# Patient Record
Sex: Male | Born: 1960 | Race: White | Hispanic: No | Marital: Single | Smoking: Current every day smoker
Health system: Southern US, Community
[De-identification: ages and names within clinical notes are randomized; demographics above are authoritative.]

## PROBLEM LIST (undated history)

## (undated) DIAGNOSIS — F1010 Alcohol abuse, uncomplicated: Secondary | ICD-10-CM

## (undated) DIAGNOSIS — K7030 Alcoholic cirrhosis of liver without ascites: Secondary | ICD-10-CM

## (undated) HISTORY — PX: NO PAST SURGERIES: SHX2092

---

## 2006-02-09 ENCOUNTER — Emergency Department (HOSPITAL_COMMUNITY): Admission: EM | Admit: 2006-02-09 | Discharge: 2006-02-09 | Payer: Self-pay | Admitting: Emergency Medicine

## 2012-05-02 ENCOUNTER — Emergency Department (HOSPITAL_COMMUNITY)
Admission: EM | Admit: 2012-05-02 | Discharge: 2012-05-02 | Disposition: A | Discharge status: Home / Self Care | Payer: Self-pay | Attending: Emergency Medicine | Admitting: Emergency Medicine

## 2012-05-02 ENCOUNTER — Encounter (HOSPITAL_COMMUNITY): Payer: Self-pay

## 2012-05-02 DIAGNOSIS — M549 Dorsalgia, unspecified: Secondary | ICD-10-CM | POA: Insufficient documentation

## 2012-05-02 DIAGNOSIS — H5789 Other specified disorders of eye and adnexa: Secondary | ICD-10-CM | POA: Insufficient documentation

## 2012-05-02 NOTE — ED Notes (Signed)
Pt here with back pain to bilateral legs, sts burning in nature, etoh on board, vomits q AM, Skin lesions, Cyst under eye that he is concerned for. Dark Urine, BP stable, CBG 110 with ems, bruising to forearm

## 2017-07-16 ENCOUNTER — Emergency Department (HOSPITAL_COMMUNITY): Payer: BLUE CROSS/BLUE SHIELD

## 2017-07-16 ENCOUNTER — Encounter (HOSPITAL_COMMUNITY): Payer: Self-pay | Admitting: *Deleted

## 2017-07-16 ENCOUNTER — Observation Stay (HOSPITAL_COMMUNITY)
Admission: EM | Admit: 2017-07-16 | Discharge: 2017-07-17 | Disposition: A | Discharge status: Home / Self Care | Payer: BLUE CROSS/BLUE SHIELD | Attending: Family Medicine | Admitting: Family Medicine

## 2017-07-16 DIAGNOSIS — F1010 Alcohol abuse, uncomplicated: Secondary | ICD-10-CM | POA: Insufficient documentation

## 2017-07-16 DIAGNOSIS — I8500 Esophageal varices without bleeding: Secondary | ICD-10-CM

## 2017-07-16 DIAGNOSIS — E871 Hypo-osmolality and hyponatremia: Secondary | ICD-10-CM | POA: Diagnosis not present

## 2017-07-16 DIAGNOSIS — R109 Unspecified abdominal pain: Secondary | ICD-10-CM | POA: Diagnosis present

## 2017-07-16 DIAGNOSIS — I8510 Secondary esophageal varices without bleeding: Secondary | ICD-10-CM | POA: Diagnosis not present

## 2017-07-16 DIAGNOSIS — N281 Cyst of kidney, acquired: Secondary | ICD-10-CM | POA: Diagnosis not present

## 2017-07-16 DIAGNOSIS — K7460 Unspecified cirrhosis of liver: Secondary | ICD-10-CM | POA: Diagnosis not present

## 2017-07-16 DIAGNOSIS — Z23 Encounter for immunization: Secondary | ICD-10-CM | POA: Diagnosis not present

## 2017-07-16 DIAGNOSIS — K449 Diaphragmatic hernia without obstruction or gangrene: Secondary | ICD-10-CM | POA: Insufficient documentation

## 2017-07-16 DIAGNOSIS — R188 Other ascites: Secondary | ICD-10-CM | POA: Diagnosis not present

## 2017-07-16 DIAGNOSIS — K766 Portal hypertension: Secondary | ICD-10-CM | POA: Diagnosis not present

## 2017-07-16 DIAGNOSIS — I864 Gastric varices: Secondary | ICD-10-CM | POA: Insufficient documentation

## 2017-07-16 DIAGNOSIS — F17210 Nicotine dependence, cigarettes, uncomplicated: Secondary | ICD-10-CM | POA: Insufficient documentation

## 2017-07-16 LAB — CBC
HCT: 37.7 % — ABNORMAL LOW (ref 39.0–52.0)
Hemoglobin: 13.4 g/dL (ref 13.0–17.0)
MCH: 36.3 pg — AB (ref 26.0–34.0)
MCHC: 35.5 g/dL (ref 30.0–36.0)
MCV: 102.2 fL — ABNORMAL HIGH (ref 78.0–100.0)
PLATELETS: 356 10*3/uL (ref 150–400)
RBC: 3.69 MIL/uL — ABNORMAL LOW (ref 4.22–5.81)
RDW: 12.3 % (ref 11.5–15.5)
WBC: 13.7 10*3/uL — ABNORMAL HIGH (ref 4.0–10.5)

## 2017-07-16 LAB — COMPREHENSIVE METABOLIC PANEL
ALBUMIN: 2.7 g/dL — AB (ref 3.5–5.0)
ALK PHOS: 145 U/L — AB (ref 38–126)
ALT: 42 U/L (ref 17–63)
AST: 74 U/L — AB (ref 15–41)
Anion gap: 11 (ref 5–15)
BUN: 7 mg/dL (ref 6–20)
CALCIUM: 8.6 mg/dL — AB (ref 8.9–10.3)
CO2: 23 mmol/L (ref 22–32)
CREATININE: 0.93 mg/dL (ref 0.61–1.24)
Chloride: 96 mmol/L — ABNORMAL LOW (ref 101–111)
GFR calc Af Amer: >60 mL/min (ref >60)
GFR calc non Af Amer: >60 mL/min (ref >60)
GLUCOSE: 106 mg/dL — AB (ref 65–99)
Potassium: 3.8 mmol/L (ref 3.5–5.1)
SODIUM: 130 mmol/L — AB (ref 135–145)
Total Bilirubin: 1.3 mg/dL — ABNORMAL HIGH (ref 0.3–1.2)
Total Protein: 6.5 g/dL (ref 6.5–8.1)

## 2017-07-16 LAB — LIPASE, BLOOD: Lipase: 69 U/L — ABNORMAL HIGH (ref 11–51)

## 2017-07-16 LAB — PROTIME-INR
INR: 1.07
PROTHROMBIN TIME: 13.8 s (ref 11.4–15.2)

## 2017-07-16 MED ORDER — ONDANSETRON HCL 4 MG/2ML IJ SOLN
4.0000 mg | Freq: Once | INTRAMUSCULAR | Status: AC
  Administered 2017-07-16: 4 mg via INTRAVENOUS
  Filled 2017-07-16: qty 2

## 2017-07-16 MED ORDER — SODIUM CHLORIDE 0.9 % IV BOLUS (SEPSIS)
500.0000 mL | Freq: Once | INTRAVENOUS | Status: AC
  Administered 2017-07-16: 500 mL via INTRAVENOUS

## 2017-07-16 MED ORDER — IOPAMIDOL (ISOVUE-300) INJECTION 61%
INTRAVENOUS | Status: AC
  Administered 2017-07-16: 100 mL
  Filled 2017-07-16: qty 30

## 2017-07-16 NOTE — ED Notes (Addendum)
Pt stated he couldn't provide a urine sample right now

## 2017-07-16 NOTE — ED Notes (Signed)
Pt transported back to CT 

## 2017-07-16 NOTE — ED Notes (Signed)
Pt aware of need for urine  

## 2017-07-16 NOTE — ED Triage Notes (Signed)
Pt states that he has had lower abdominal and groin pain for 2 months reports swelling and that his "belly button will push out" but states that it will go back in as well. Pt reports generalized weakness and not being able to eat.

## 2017-07-16 NOTE — ED Notes (Signed)
Pt provided with urinal and encouraged to attempt urine sample

## 2017-07-16 NOTE — ED Notes (Signed)
Pt brought back from CT.  States he had to have diarrhea.  Pt assisted to the restroom, gown changed, and pt given belongings bag for soiled underwear

## 2017-07-16 NOTE — ED Provider Notes (Signed)
[facility] EMERGENCY DEPARTMENT Provider Note   CSN: [id number] Arrival date & time: 07/16/17  1616     History   Chief Complaint Chief Complaint  Patient presents with  . Abdominal Pain    HPI Rontrell Moquin is a 56 y.o. male.  HPI 56 year old male who presents with abdominal pain. He has no known past medical history. Reports that for the past 2 months he has had gradually increasing abdominal distention and intermittent abdominal pain. Sometimes when he lays in bed at night he feels a hardness over the right side the abdomen. He has been having intermittent episodes of nausea, vomiting and diarrhea. Sometimes will noticed a streak of blood in his vomit or blood in the stool. Has had decreased appetite and more than a 25 pound weight loss in this period of time. Denies fevers, night sweats, melena.  States that he has been smoking and drinking alcohol for almost his entire life. Normally drinks about 40 ounces of beer after work on a daily basis. Smokes 2 packs of cigarettes daily. No cough, difficulty breathing, chest pains.    History reviewed. No pertinent past medical history.  There are no active problems to display for this patient.   History reviewed. No pertinent surgical history.     Home Medications    Prior to Admission medications   Not on File    Family History No family history on file.  Social History Social History  Substance Use Topics  . Smoking status: Current Every Day Smoker    Packs/day: 2.00  . Smokeless tobacco: Not on file  . Alcohol use Yes     Allergies   Patient has no known allergies.   Review of Systems Review of Systems  Constitutional: Negative for fever.  Respiratory: Negative for shortness of breath.   Cardiovascular: Negative for chest pain.  Gastrointestinal: Positive for abdominal pain, diarrhea and vomiting.  All other systems reviewed and are negative.    Physical Exam Updated Vital  Signs BP 103/79   Pulse 81   Temp 97.7 F (36.5 C) (Oral)   Resp 16   Ht 5\' 8"  (1.727 m)   Wt 45.4 kg (100 lb)   SpO2 100%   BMI 15.20 kg/m   Physical Exam Physical Exam  Nursing note and vitals reviewed. Constitutional: Cachectic, malnourished appearing, non-toxic, and in no acute distress Head: Normocephalic and atraumatic.  Mouth/Throat: Oropharynx is clear and dry.  Neck: Normal range of motion. Neck supple.  Cardiovascular: Normal rate and regular rhythm.   Pulmonary/Chest: Effort normal and breath sounds normal.  Abdominal: Soft. There is moderate distension. Mild diffuse tenderness. There is no rebound and no guarding.  Musculoskeletal: No deformities. Neurological: Alert, no facial droop, fluent speech, moves all extremities symmetrically Skin: Skin is warm and dry.  Psychiatric: Cooperative   ED Treatments / Results  Labs (all labs ordered are listed, but only abnormal results are displayed) Labs Reviewed  LIPASE, BLOOD - Abnormal; Notable for the following:       Result Value   Lipase 69 (*)    All other components within normal limits  COMPREHENSIVE METABOLIC PANEL - Abnormal; Notable for the following:    Sodium 130 (*)    Chloride 96 (*)    Glucose, Bld 106 (*)    Calcium 8.6 (*)    Albumin 2.7 (*)    AST 74 (*)    Alkaline Phosphatase 145 (*)    Total Bilirubin 1.3 (*)  All other components within normal limits  CBC - Abnormal; Notable for the following:    WBC 13.7 (*)    RBC 3.69 (*)    HCT 37.7 (*)    MCV 102.2 (*)    MCH 36.3 (*)    All other components within normal limits  URINALYSIS, ROUTINE W REFLEX MICROSCOPIC - Abnormal; Notable for the following:    Color, Urine AMBER (*)    APPearance CLOUDY (*)    Bilirubin Urine SMALL (*)    Protein, ur 100 (*)    Bacteria, UA RARE (*)    Squamous Epithelial / LPF 0-5 (*)    All other components within normal limits  PROTIME-INR    EKG  EKG Interpretation None       Radiology Dg  Chest 2 View  Result Date: 07/16/2017 CLINICAL DATA:  Weight loss.  Cough. EXAM: CHEST  2 VIEW COMPARISON:  None. FINDINGS: Normal heart size. Normal aortic and hilar contours. Calcified nodule in the right lung attributed to old granulomatous disease. There is no edema, consolidation, effusion, or pneumothorax. No acute osseous finding. IMPRESSION: 1. No evidence of active disease. 2. Remote granulomatous changes in the right lung. Electronically Signed   By: Marnee Spring M.D.   On: 07/16/2017 20:52   Ct Abdomen Pelvis W Contrast  Result Date: 07/17/2017 CLINICAL DATA:  Chronic intermittent right-sided abdominal pain and abdominal distention. Nausea, vomiting, diarrhea and weight loss. Initial encounter. EXAM: CT ABDOMEN AND PELVIS WITH CONTRAST TECHNIQUE: Multidetector CT imaging of the abdomen and pelvis was performed using the standard protocol following bolus administration of intravenous contrast. CONTRAST:  ISOVUE-300 IOPAMIDOL (ISOVUE-300) INJECTION 61% COMPARISON:  None. FINDINGS: Lower chest: A small to moderate hiatal hernia is identified. Esophageal varices are seen. The visualized portions of the mediastinum are unremarkable. The visualized lung bases are clear. Hepatobiliary: There is a very unusual poorly enhancing process involving most of the hepatic parenchyma. This predominantly does not distort the hepatic capsule and underlying vasculature, aside from masslike enlargement of the caudate lobe. Given underlying findings of hepatic cirrhosis and this appearance, confluent hepatic fibrosis is considered more likely. Malignancy cannot be entirely excluded. The gallbladder is grossly unremarkable in appearance. The common bile duct remains grossly normal in caliber. Pancreas: The pancreas is within normal limits. Spleen: The spleen is unremarkable in appearance. Adrenals/Urinary Tract: The adrenal glands are unremarkable in appearance. A small right renal cyst is noted. There is no  evidence of hydronephrosis. No renal or ureteral stones are identified. No perinephric stranding is appreciated. Stomach/Bowel: The stomach is unremarkable in appearance. Gastric varices are noted. The small bowel is within normal limits. The appendix is normal in caliber, without evidence of appendicitis. Wall thickening along the entirety of the colon may reflect underlying hypoalbuminemia. Vascular/Lymphatic: Minimal calcification is noted along the distal abdominal aorta and its branches. The abdominal aorta is otherwise grossly unremarkable. The inferior vena cava is grossly unremarkable. No retroperitoneal lymphadenopathy is seen. No pelvic sidewall lymphadenopathy is identified. Reproductive: The bladder is mildly distended and grossly unremarkable. The prostate remains normal in size. Other: Moderate volume ascites is noted within the abdomen and pelvis. Musculoskeletal: No acute osseous abnormalities are identified. The visualized musculature is unremarkable in appearance. IMPRESSION: 1. Very unusual poorly enhancing process involving most of the hepatic parenchyma. This primarily does not distort the hepatic capsule and underlying vasculature, aside from masslike enlargement of the caudate lobe. Given underlying hepatic cirrhosis and this appearance, confluent hepatic fibrosis is considered more  likely. Malignancy cannot be entirely excluded. Dynamic liver protocol MRI is recommended for further evaluation, when and as deemed clinically appropriate. 2. Moderate volume ascites, gastric varices and esophageal varices, compatible with hepatic cirrhosis. 3. Wall thickening along the entirety of the colon may reflect underlying hypoalbuminemia. 4. Small to moderate hiatal hernia noted. 5. Small right renal cyst seen. Electronically Signed   By: Roanna RaiderJeffery  Chang M.D.   On: 07/17/2017 00:36    Procedures Procedures (including critical care time)  Medications Ordered in ED Medications  cefTRIAXone (ROCEPHIN)  1 g in dextrose 5 % 50 mL IVPB (not administered)  sodium chloride 0.9 % bolus 500 mL (500 mLs Intravenous New Bag/Given 07/16/17 2109)  ondansetron (ZOFRAN) injection 4 mg (4 mg Intravenous Given 07/16/17 2109)  iopamidol (ISOVUE-300) 61 % injection (100 mLs  Contrast Given 07/16/17 2353)     Initial Impression / Assessment and Plan / ED Course  I have reviewed the triage vital signs and the nursing notes.  Pertinent labs & imaging results that were available during my care of the patient were reviewed by me and considered in my medical decision making (see chart for details).     56 year old male who presents with 2 month history of weight loss, intermittent nausea and vomiting with diarrhea and abdominal distention and pain. He looks cachectic and chronically ill on presentation. Concern for possible cirrhosis versus malignant process. 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 Abdomen distended, soft, generalized tenderness. With evidence of mild transaminitis, elevated alkaline phosphatase, minimal hyperbilirubinemia, and hypoalbuminemia. CT abdomen pelvis showing ascites with gastric and esophageal varices and cirrhotic liver with questionable underlying mass. He does have mild leukocytosis, and in setting of abdominal pain and ascites, will empirically treat for SBP. Plan on admission into hospital for ongoing work-up. Discussed with Dr. Selena BattenKim.  Final Clinical Impressions(s) / ED Diagnoses   Final diagnoses:  Cirrhosis of liver with ascites, unspecified hepatic cirrhosis type [place](HCC)    New Prescriptions New Prescriptions   No medications on file     Lavera GuiseLiu, Dana Duo, MD 07/17/17 [phone]

## 2017-07-16 NOTE — ED Notes (Signed)
Pt given warm blanket.

## 2017-07-16 NOTE — ED Notes (Signed)
Pt transported to xray 

## 2017-07-16 NOTE — ED Notes (Signed)
This RN attempted IV x2  

## 2017-07-16 NOTE — ED Notes (Signed)
Pt remains in triage waiting. Updated on wait for treatment room. 

## 2017-07-17 ENCOUNTER — Encounter (HOSPITAL_COMMUNITY): Payer: Self-pay | Admitting: Radiology

## 2017-07-17 ENCOUNTER — Observation Stay (HOSPITAL_COMMUNITY): Payer: BLUE CROSS/BLUE SHIELD

## 2017-07-17 DIAGNOSIS — I8510 Secondary esophageal varices without bleeding: Secondary | ICD-10-CM

## 2017-07-17 DIAGNOSIS — R188 Other ascites: Secondary | ICD-10-CM

## 2017-07-17 DIAGNOSIS — R1011 Right upper quadrant pain: Secondary | ICD-10-CM | POA: Diagnosis not present

## 2017-07-17 DIAGNOSIS — E871 Hypo-osmolality and hyponatremia: Secondary | ICD-10-CM

## 2017-07-17 DIAGNOSIS — K7031 Alcoholic cirrhosis of liver with ascites: Secondary | ICD-10-CM

## 2017-07-17 DIAGNOSIS — K7460 Unspecified cirrhosis of liver: Secondary | ICD-10-CM | POA: Diagnosis not present

## 2017-07-17 DIAGNOSIS — R109 Unspecified abdominal pain: Secondary | ICD-10-CM | POA: Diagnosis present

## 2017-07-17 DIAGNOSIS — I8500 Esophageal varices without bleeding: Secondary | ICD-10-CM

## 2017-07-17 LAB — SODIUM, URINE, RANDOM

## 2017-07-17 LAB — LACTATE DEHYDROGENASE, PLEURAL OR PERITONEAL FLUID: LD FL: 28 U/L — AB (ref 3–23)

## 2017-07-17 LAB — BODY FLUID CELL COUNT WITH DIFFERENTIAL
EOS FL: 0 %
LYMPHS FL: 8 %
Monocyte-Macrophage-Serous Fluid: 86 % (ref 50–90)
NEUTROPHIL FLUID: 6 % (ref 0–25)
Total Nucleated Cell Count, Fluid: 196 cu mm (ref 0–1000)

## 2017-07-17 LAB — URINALYSIS, ROUTINE W REFLEX MICROSCOPIC
Glucose, UA: NEGATIVE mg/dL
Hgb urine dipstick: NEGATIVE
Ketones, ur: NEGATIVE mg/dL
LEUKOCYTES UA: NEGATIVE
NITRITE: NEGATIVE
PROTEIN: 100 mg/dL — AB
SPECIFIC GRAVITY, URINE: 1.023 (ref 1.005–1.030)
pH: 5 (ref 5.0–8.0)

## 2017-07-17 LAB — COMPREHENSIVE METABOLIC PANEL
ALBUMIN: 2.3 g/dL — AB (ref 3.5–5.0)
ALK PHOS: 113 U/L (ref 38–126)
ALT: 36 U/L (ref 17–63)
AST: 62 U/L — AB (ref 15–41)
Anion gap: 13 (ref 5–15)
BILIRUBIN TOTAL: 1.2 mg/dL (ref 0.3–1.2)
BUN: 7 mg/dL (ref 6–20)
CALCIUM: 8 mg/dL — AB (ref 8.9–10.3)
CO2: 19 mmol/L — ABNORMAL LOW (ref 22–32)
CREATININE: 0.83 mg/dL (ref 0.61–1.24)
Chloride: 98 mmol/L — ABNORMAL LOW (ref 101–111)
GFR calc Af Amer: >60 mL/min (ref >60)
GLUCOSE: 72 mg/dL (ref 65–99)
Potassium: 2.9 mmol/L — ABNORMAL LOW (ref 3.5–5.1)
Sodium: 130 mmol/L — ABNORMAL LOW (ref 135–145)
TOTAL PROTEIN: 5.3 g/dL — AB (ref 6.5–8.1)

## 2017-07-17 LAB — TSH: TSH: 2.763 u[IU]/mL (ref 0.350–4.500)

## 2017-07-17 LAB — CORTISOL: CORTISOL PLASMA: 15.7 ug/dL

## 2017-07-17 LAB — CBC
HEMATOCRIT: 32.9 % — AB (ref 39.0–52.0)
Hemoglobin: 11.7 g/dL — ABNORMAL LOW (ref 13.0–17.0)
MCH: 36.1 pg — AB (ref 26.0–34.0)
MCHC: 35.6 g/dL (ref 30.0–36.0)
MCV: 101.5 fL — ABNORMAL HIGH (ref 78.0–100.0)
Platelets: 321 10*3/uL (ref 150–400)
RBC: 3.24 MIL/uL — ABNORMAL LOW (ref 4.22–5.81)
RDW: 12.5 % (ref 11.5–15.5)
WBC: 11.9 10*3/uL — AB (ref 4.0–10.5)

## 2017-07-17 LAB — HIV ANTIBODY (ROUTINE TESTING W REFLEX): HIV Screen 4th Generation wRfx: NONREACTIVE

## 2017-07-17 LAB — GRAM STAIN

## 2017-07-17 LAB — OSMOLALITY, URINE: Osmolality, Ur: 606 mOsm/kg (ref 300–900)

## 2017-07-17 LAB — PROTEIN, PLEURAL OR PERITONEAL FLUID

## 2017-07-17 LAB — MAGNESIUM: Magnesium: 1.5 mg/dL — ABNORMAL LOW (ref 1.7–2.4)

## 2017-07-17 LAB — ALBUMIN, PLEURAL OR PERITONEAL FLUID

## 2017-07-17 LAB — OSMOLALITY: OSMOLALITY: 269 mosm/kg — AB (ref 275–295)

## 2017-07-17 MED ORDER — HYOSCYAMINE SULFATE 0.125 MG PO TBDP
0.1250 mg | ORAL_TABLET | ORAL | Status: DC | PRN
  Administered 2017-07-17: 0.125 mg via SUBLINGUAL
  Filled 2017-07-17 (×2): qty 1

## 2017-07-17 MED ORDER — ENSURE ENLIVE PO LIQD: 237.0000 mL | Freq: Two times a day (BID) | ORAL | Status: DC

## 2017-07-17 MED ORDER — ADULT MULTIVITAMIN W/MINERALS CH
1.0000 | ORAL_TABLET | Freq: Every day | ORAL | Status: DC
  Administered 2017-07-17: 1 via ORAL
  Filled 2017-07-17: qty 1

## 2017-07-17 MED ORDER — POTASSIUM CHLORIDE CRYS ER 20 MEQ PO TBCR
40.0000 meq | EXTENDED_RELEASE_TABLET | Freq: Once | ORAL | Status: AC
  Administered 2017-07-17: 40 meq via ORAL
  Filled 2017-07-17: qty 2

## 2017-07-17 MED ORDER — PNEUMOCOCCAL VAC POLYVALENT 25 MCG/0.5ML IJ INJ
0.5000 mL | INJECTION | INTRAMUSCULAR | Status: DC
  Filled 2017-07-17: qty 0.5

## 2017-07-17 MED ORDER — GADOBENATE DIMEGLUMINE 529 MG/ML IV SOLN
10.0000 mL | Freq: Once | INTRAVENOUS | Status: AC | PRN
  Administered 2017-07-17: 10 mL via INTRAVENOUS

## 2017-07-17 MED ORDER — SODIUM CHLORIDE 0.9 % IV SOLN: 250.0000 mL | INTRAVENOUS | Status: DC | PRN

## 2017-07-17 MED ORDER — VITAMIN B-1 100 MG PO TABS
100.0000 mg | ORAL_TABLET | Freq: Every day | ORAL | Status: DC
  Administered 2017-07-17: 100 mg via ORAL
  Filled 2017-07-17: qty 1

## 2017-07-17 MED ORDER — DEXTROSE 5 % IV SOLN
1.0000 g | Freq: Once | INTRAVENOUS | Status: AC
  Administered 2017-07-17: 1 g via INTRAVENOUS
  Filled 2017-07-17: qty 10

## 2017-07-17 MED ORDER — LORAZEPAM 1 MG PO TABS: 1.0000 mg | ORAL_TABLET | Freq: Four times a day (QID) | ORAL | Status: DC | PRN

## 2017-07-17 MED ORDER — THIAMINE HCL 100 MG PO TABS: 100.0000 mg | ORAL_TABLET | Freq: Every day | ORAL | 0 refills | Status: DC

## 2017-07-17 MED ORDER — INFLUENZA VAC SPLIT QUAD 0.5 ML IM SUSY
0.5000 mL | PREFILLED_SYRINGE | INTRAMUSCULAR | Status: AC
  Administered 2017-07-17: 0.5 mL via INTRAMUSCULAR
  Filled 2017-07-17: qty 0.5

## 2017-07-17 MED ORDER — LIDOCAINE HCL (PF) 1 % IJ SOLN
INTRAMUSCULAR | Status: AC
  Administered 2017-07-17: 10:00:00 via SUBCUTANEOUS
  Filled 2017-07-17: qty 30

## 2017-07-17 MED ORDER — FOLIC ACID 1 MG PO TABS
1.0000 mg | ORAL_TABLET | Freq: Every day | ORAL | Status: DC
  Administered 2017-07-17: 1 mg via ORAL
  Filled 2017-07-17: qty 1

## 2017-07-17 MED ORDER — SODIUM CHLORIDE 0.9% FLUSH
3.0000 mL | Freq: Two times a day (BID) | INTRAVENOUS | Status: DC
  Administered 2017-07-17: 3 mL via INTRAVENOUS

## 2017-07-17 MED ORDER — SODIUM CHLORIDE 0.9% FLUSH: 3.0000 mL | INTRAVENOUS | Status: DC | PRN

## 2017-07-17 MED ORDER — PRO-STAT SUGAR FREE PO LIQD: 30.0000 mL | Freq: Three times a day (TID) | ORAL | Status: DC

## 2017-07-17 MED ORDER — HYOSCYAMINE SULFATE 0.125 MG PO TABS
0.1250 mg | ORAL_TABLET | ORAL | Status: DC | PRN
  Filled 2017-07-17 (×2): qty 1

## 2017-07-17 MED ORDER — THIAMINE HCL 100 MG/ML IJ SOLN: 100.0000 mg | Freq: Every day | INTRAMUSCULAR | Status: DC

## 2017-07-17 MED ORDER — FOLIC ACID 1 MG PO TABS: 1.0000 mg | ORAL_TABLET | Freq: Every day | ORAL | 0 refills | Status: DC

## 2017-07-17 MED ORDER — LORAZEPAM 2 MG/ML IJ SOLN: 1.0000 mg | Freq: Four times a day (QID) | INTRAMUSCULAR | Status: DC | PRN

## 2017-07-17 MED ORDER — SPIRONOLACTONE 25 MG PO TABS: 25.0000 mg | ORAL_TABLET | Freq: Every day | ORAL | 11 refills | Status: DC

## 2017-07-17 MED ORDER — TRAMADOL HCL 50 MG PO TABS: 50.0000 mg | ORAL_TABLET | Freq: Four times a day (QID) | ORAL | Status: DC | PRN

## 2017-07-17 MED ORDER — NICOTINE 21 MG/24HR TD PT24
21.0000 mg | MEDICATED_PATCH | Freq: Once | TRANSDERMAL | Status: DC
  Administered 2017-07-17: 21 mg via TRANSDERMAL
  Filled 2017-07-17: qty 1

## 2017-07-17 NOTE — Progress Notes (Signed)
Discharge instructions given to patient.  These included, and did not exclude, the following:  Prescriptions, follow-up appointments, discussion of hospitalization rationale and importance of follow-up treatment for alcoholism, medications and potential side effects, vaccination educational material, dietary needs, and need to increase protein intake.  Patient indicated he comprehended the instructions via the "teach-back" method.  Was incontinent medium soft stool.  Awaiting word from SW re:  cab vs. bus pass.

## 2017-07-17 NOTE — Progress Notes (Signed)
Initial Nutrition Assessment  DOCUMENTATION CODES:  Severe malnutrition in context of chronic illness, Underweight  INTERVENTION:  Once diet is advanced Ensure Enlive po BID, each supplement provides 350 kcal and 20 grams of protein  Will order 30 mL Prostat TID, each supplement provides 100 kcal and 15 grams of protein.  NUTRITION DIAGNOSIS:  Severe Malnutrition related to catabolic illness (cirrhosis), poor appetite, vomiting, nausea, decreased appetite, dysphagia (cirrhosis) as evidenced by severe muscle/fat loss  GOAL:  Patient will meet greater than or equal to 90% of their needs  MONITOR:  PO intake, Supplement acceptance, Diet advancement, Labs, I & O's  REASON FOR ASSESSMENT:  Malnutrition Screening Tool    ASSESSMENT:  56 y/o male w/o known past medical hx. Presents with 2 month history of increased abdominal distention and intermittent abdominal pain. Has been having nausea, vomiting, diarrhea, decreased appetite and loss of 25 lbs in the 2 months. He has smoked and drank etoh his entire life. (1 40oz qday). Imaging shows severe cirrhosis, ascites and esophageal +gastric varices.   Patient reports 2 months of poor intake. He states the first month, he has severe vomiting and diarrhea. He would vomit up to 5x/day. The second month, he says he "could not eat". When asked to elaborate, he says he specifies that he was unable to swallow (due to varices?). He says all he could swallow was liquids. As such, he mainly consumed soups, clear liquids and Ensure x1/day. He would eat approximately 2x/day.  He did not take any vitamins. He says he drank 1 "40" each day.   There is no weight history in chart. He says his UBW is 125 lbs and he has lost all 25 lbs in the last 2 months. He notes his clothes are much looser  Physical Exam: Cachectic/emaciated. Severe muscle wasting of entire lower body and temporalis. Severe underarm, buccal fat wasting. He has bruising on his arms, likely  related to cirrhosis.   At this time, patient is on a clear liquid diet. He had tried the [employer]. He said he did not like it at all. He does not want any more jello. RD asked about any foods he might want, but he did not want to talk about this and says he just wants to go home. He was agreeable to atleast continuing Ensure. Will also try prostat. Told he needs to stop drinking etoh.   Labs: Mag: 1.5, WBC:11.9, H/H:11.7/32.9, Albumin: 2.3, K: 2.9, Na:130 Meds: MVI with min, Folate, Thiamin, KCL, IV abx  NUTRITION - FOCUSED PHYSICAL EXAM:   Most Recent Value  Orbital Region  Moderate depletion  Upper Arm Region  Severe depletion  Thoracic and Lumbar Region  Severe depletion  Buccal Region  Severe depletion  Temple Region  Moderate depletion  Clavicle Bone Region  Moderate depletion  Clavicle and Acromion Bone Region  Moderate depletion  Scapular Bone Region  Unable to assess  Dorsal Hand  Moderate depletion  Patellar Region  Severe depletion  Anterior Thigh Region  Severe depletion  Posterior Calf Region  Severe depletion  Edema (RD Assessment)  None      Diet Order:  Diet clear liquid Room service appropriate? Yes; Fluid consistency: Thin  EDUCATION NEEDS:  No education needs have been identified at this time  Skin:  Skin Assessment: Reviewed RN Assessment  Last BM:  10/27  Height:  Ht Readings from Last 1 Encounters:  07/17/17 5\' 8"  (1.727 m)   Weight:  Wt Readings from Last 1 Encounters:  07/17/17 100 lb (45.4 kg)   Ideal Body Weight:  70 kg  BMI:  Body mass index is 15.2 kg/m.  Estimated Nutritional Needs:  Kcal:  1600-1800 kcals (35-40 kcal/kg bw) Protein:  80-90g pro (20% kcals) Fluid:  >1.6 L (35 ml/kg bw)  Christophe LouisNathan Franks RD, LDN, CNSC Clinical Nutrition Pager: [phone] 07/17/2017 1:58 PM

## 2017-07-17 NOTE — Progress Notes (Signed)
[facility] informed that patient is having 8/10 abd pain and no orders for pain management. Ilean SkillVeronica Shaolin Armas LPN

## 2017-07-17 NOTE — Progress Notes (Signed)
CSW was consulted by patients RN for transportation needs. CSW assisted patient with a cab voucher home.   Marrianne MoodAshley Lemarcus Baggerly, MSW,  [employer]WA [phone]

## 2017-07-17 NOTE — Progress Notes (Signed)
Called SW for bus pass for pt DC.

## 2017-07-17 NOTE — ED Notes (Signed)
MD at bedside. 

## 2017-07-17 NOTE — ED Notes (Signed)
MD at bedside updating patient.

## 2017-07-17 NOTE — ED Notes (Signed)
Admitting at bedside 

## 2017-07-17 NOTE — Progress Notes (Signed)
This is a no charge note.  For further details, please see H&P from my partner Dr. Selena BattenKim earlier today.  Paracentesis today with 2.7L yellow fluid.  Cell counts pending.  MRI abdomen shows cirrhosis, no suspicious masses.  Patient eager to go home.  If PMN count in ascitic fluid rules out SBP, will discuss f/u with GI on call.

## 2017-07-17 NOTE — Procedures (Signed)
   US guided RLQ paracentesis 2.6 L yellow fluid  Sent for labs per MD  Tolerated well

## 2017-07-17 NOTE — Progress Notes (Signed)
Patient discharged to home via taxi.  Escorted to exit via wheelchair by nurse tech.

## 2017-07-17 NOTE — H&P (Signed)
TRH H&P   Patient Demographics:    John Murphy, is a 56 y.o. male  MRN: [id number]   DOB - 1961-08-06  Admit Date - 07/16/2017  Outpatient Primary MD for the patient is Patient, No Pcp Per  Referring MD/NP/PA: Crista Curb  Outpatient Specialists:    Patient coming from: home  Chief Complaint  Patient presents with  . Abdominal Pain      HPI:    John Murphy  is a 56 y.o. male, w c/o abdominal pain, diffuse for the past 2months.  + weight loss.   Denies fever, chills, cp, palp, sob, n/v, diarrhea, brbpr, black stool.  Pt presented to ED due to abdominal pain.    In ED,  Wbc 13.7, Hgb 13.4, Plt 356 Bun 7 creatinine 0.93 Na=130   CT scan   IMPRESSION: 1. Very unusual poorly enhancing process involving most of the hepatic parenchyma. This primarily does not distort the hepatic capsule and underlying vasculature, aside from masslike enlargement of the caudate lobe. Given underlying hepatic cirrhosis and this appearance, confluent hepatic fibrosis is considered more likely. Malignancy cannot be entirely excluded. Dynamic liver protocol MRI is recommended for further evaluation, when and as deemed clinically appropriate. 2. Moderate volume ascites, gastric varices and esophageal varices, compatible with hepatic cirrhosis. 3. Wall thickening along the entirety of the colon may reflect underlying hypoalbuminemia. 4. Small to moderate hiatal hernia noted. 5. Small right renal cyst seen.   Pt will be admitted for abdominal pain, ascites, varices/ cirrhosis and hyponatremia    Review of systems:    In addition to the HPI above,  No Fever-chills, No Headache, No changes with Vision or hearing, No problems swallowing food or Liquids, No Chest pain, Cough or Shortness of Breath, No Blood in stool or Urine, No dysuria, No new skin rashes or bruises, No new joints  pains-aches,  No new weakness, tingling, numbness in any extremity, No recent weight gain or loss, No polyuria, polydypsia or polyphagia, No significant Mental Stressors.  A full 10 point Review of Systems was done, except as stated above, all other Review of Systems were negative.   With Past History of the following :    History reviewed. No pertinent past medical history.    History reviewed. No pertinent surgical history.    Social History:     Social History  Substance Use Topics  . Smoking status: Current Every Day Smoker    Packs/day: 2.00  . Smokeless tobacco: Never Used  . Alcohol use Yes     Lives - at home Mobility - walks by self  Family History :    No family history on file. No family hx of cirrhosis   Home Medications:   Prior to Admission medications   Not on File     Allergies:    No Known Allergies   Physical Exam:   Vitals  Blood pressure 100/76, pulse 85, temperature 97.7 F (36.5 C), temperature source Oral, resp. rate 16, height 5\' 8"  (1.727 m), weight 45.4 kg (100 lb), SpO2 100 %.   1. General  lying in bed in NAD,    2. Normal affect and insight, Not Suicidal or Homicidal, Awake Alert, Oriented X 3.  3. No F.N deficits, ALL C.Nerves Intact, Strength 5/5 all 4 extremities, Sensation intact all 4 extremities, Plantars down going.  4. Ears and Eyes appear Normal, Conjunctivae clear, PERRLA. Moist Oral Mucosa.  5. Supple Neck, No JVD, No cervical lymphadenopathy appriciated, No Carotid Bruits.  6. Symmetrical Chest wall movement, Good air movement bilaterally, CTAB.  7. RRR, No Gallops, Rubs or Murmurs, No Parasternal Heave.  8. Slight abdominal distensiion, nt, + bs,   9.  No Cyanosis, Normal Skin Turgor, No Skin Rash or Bruise.  10. Good muscle tone,  joints appear normal , no effusions, Normal ROM.  11. No Palpable Lymph Nodes in Neck or Axillae  No palmar erythema, + telangiectasia   Data Review:    CBC  Recent  Labs Lab 07/16/17 1655  WBC 13.7*  HGB 13.4  HCT 37.7*  PLT 356  MCV 102.2*  MCH 36.3*  MCHC 35.5  RDW 12.3   ------------------------------------------------------------------------------------------------------------------  Chemistries   Recent Labs Lab 07/16/17 1655  NA 130*  K 3.8  CL 96*  CO2 23  GLUCOSE 106*  BUN 7  CREATININE 0.93  CALCIUM 8.6*  AST 74*  ALT 42  ALKPHOS 145*  BILITOT 1.3*   ------------------------------------------------------------------------------------------------------------------ estimated creatinine clearance is 57 mL/min (by C-G formula based on SCr of 0.93 mg/dL). ------------------------------------------------------------------------------------------------------------------ No results for input(s): TSH, T4TOTAL, T3FREE, THYROIDAB in the last 72 hours.  Invalid input(s): FREET3  Coagulation profile  Recent Labs Lab 07/16/17 2105  INR 1.07   ------------------------------------------------------------------------------------------------------------------- No results for input(s): DDIMER in the last 72 hours. -------------------------------------------------------------------------------------------------------------------  Cardiac Enzymes No results for input(s): CKMB, TROPONINI, MYOGLOBIN in the last 168 hours.  Invalid input(s): CK ------------------------------------------------------------------------------------------------------------------ No results found for: BNP   ---------------------------------------------------------------------------------------------------------------  Urinalysis    Component Value Date/Time   COLORURINE AMBER (A) 07/16/2017 2353   APPEARANCEUR CLOUDY (A) 07/16/2017 2353   LABSPEC 1.023 07/16/2017 2353   PHURINE 5.0 07/16/2017 2353   GLUCOSEU NEGATIVE 07/16/2017 2353   HGBUR NEGATIVE 07/16/2017 2353   BILIRUBINUR SMALL (A) 07/16/2017 2353   KETONESUR NEGATIVE 07/16/2017 2353    PROTEINUR 100 (A) 07/16/2017 2353   NITRITE NEGATIVE 07/16/2017 2353   LEUKOCYTESUR NEGATIVE 07/16/2017 2353    ----------------------------------------------------------------------------------------------------------------   Imaging Results:    Dg Chest 2 View  Result Date: 07/16/2017 CLINICAL DATA:  Weight loss.  Cough. EXAM: CHEST  2 VIEW COMPARISON:  None. FINDINGS: Normal heart size. Normal aortic and hilar contours. Calcified nodule in the right lung attributed to old granulomatous disease. There is no edema, consolidation, effusion, or pneumothorax. No acute osseous finding. IMPRESSION: 1. No evidence of active disease. 2. Remote granulomatous changes in the right lung. Electronically Signed   By: Marnee Spring M.D.   On: 07/16/2017 20:52   Ct Abdomen Pelvis W Contrast  Result Date: 07/17/2017 CLINICAL DATA:  Chronic intermittent right-sided abdominal pain and abdominal distention. Nausea, vomiting, diarrhea and weight loss. Initial encounter. EXAM: CT ABDOMEN AND PELVIS WITH CONTRAST TECHNIQUE: Multidetector CT imaging of the abdomen and pelvis was performed using the standard protocol following bolus administration of intravenous contrast. CONTRAST:  ISOVUE-300 IOPAMIDOL (ISOVUE-300) INJECTION 61% COMPARISON:  None. FINDINGS: Lower chest:  A small to moderate hiatal hernia is identified. Esophageal varices are seen. The visualized portions of the mediastinum are unremarkable. The visualized lung bases are clear. Hepatobiliary: There is a very unusual poorly enhancing process involving most of the hepatic parenchyma. This predominantly does not distort the hepatic capsule and underlying vasculature, aside from masslike enlargement of the caudate lobe. Given underlying findings of hepatic cirrhosis and this appearance, confluent hepatic fibrosis is considered more likely. Malignancy cannot be entirely excluded. The gallbladder is grossly unremarkable in appearance. The common bile  duct remains grossly normal in caliber. Pancreas: The pancreas is within normal limits. Spleen: The spleen is unremarkable in appearance. Adrenals/Urinary Tract: The adrenal glands are unremarkable in appearance. A small right renal cyst is noted. There is no evidence of hydronephrosis. No renal or ureteral stones are identified. No perinephric stranding is appreciated. Stomach/Bowel: The stomach is unremarkable in appearance. Gastric varices are noted. The small bowel is within normal limits. The appendix is normal in caliber, without evidence of appendicitis. Wall thickening along the entirety of the colon may reflect underlying hypoalbuminemia. Vascular/Lymphatic: Minimal calcification is noted along the distal abdominal aorta and its branches. The abdominal aorta is otherwise grossly unremarkable. The inferior vena cava is grossly unremarkable. No retroperitoneal lymphadenopathy is seen. No pelvic sidewall lymphadenopathy is identified. Reproductive: The bladder is mildly distended and grossly unremarkable. The prostate remains normal in size. Other: Moderate volume ascites is noted within the abdomen and pelvis. Musculoskeletal: No acute osseous abnormalities are identified. The visualized musculature is unremarkable in appearance. IMPRESSION: 1. Very unusual poorly enhancing process involving most of the hepatic parenchyma. This primarily does not distort the hepatic capsule and underlying vasculature, aside from masslike enlargement of the caudate lobe. Given underlying hepatic cirrhosis and this appearance, confluent hepatic fibrosis is considered more likely. Malignancy cannot be entirely excluded. Dynamic liver protocol MRI is recommended for further evaluation, when and as deemed clinically appropriate. 2. Moderate volume ascites, gastric varices and esophageal varices, compatible with hepatic cirrhosis. 3. Wall thickening along the entirety of the colon may reflect underlying hypoalbuminemia. 4. Small to  moderate hiatal hernia noted. 5. Small right renal cyst seen. Electronically Signed   By: Roanna RaiderJeffery  Chang M.D.   On: 07/17/2017 00:36      Assessment & Plan:    Principal Problem:   Abdominal pain Active Problems:   Cirrhosis (HCC)   Ascites   Esophageal varices (HCC)   Hyponatremia    Abdominal pain Check MRI abdomen as recommended by radiology Acute hepatitis panel Check cmp in am  Ascites IR u/s guided paracentesis R/o SBP Pt received rocephin 1gm iv x1 in the ED  Esophageal varices Consider GI consultation in am Start protonix 40mg  po bid  Hyponatremia Check serum osm, cortisol, tsh, urine sodium, urine osm  ETOH abuse  CIWA  DVT Prophylaxis SCDs   AM Labs Ordered, also please review Full Orders  Family Communication: Admission, patients condition and plan of care including tests being ordered have been discussed with the patient  who indicate understanding and agree with the plan and Code Status.  Code Status FULL CODE  Likely DC to  home  Condition GUARDED    Consults called: none  Admission status: observation   Time spent in minutes : 45   Pearson GrippeJames Mavis Fichera M.D on 07/17/2017 at 1:34 AM  Between 7am to 7pm - Pager - [phone]. After 7pm go to [email or web address] - password [email or web address]  [facility] - Office  [phone]

## 2017-07-18 NOTE — Discharge Summary (Signed)
Physician Discharge Summary  John Murphy EXB:[id number] DOB: 05/30/1961 DOA: 07/16/2017  PCP: Patient, No Pcp Per  Admit date: 07/16/2017 Discharge date: 07/18/2017  Admitted From: Home Disposition:  Home  Recommendations for Outpatient Follow-up:  1. Follow up with Gastroenterology  Home Health: No Equipment/Devices: No  Discharge Condition: Fair CODE STATUS: FULL Diet recommendation: Low dosium  Brief/Interim Summary: The patient is a 56 yo M with no significant past medical history who presents with slowly progressive malaise, weakness, and now abdominal pain and swelling.  CT on arrival to ER showed diffuse liver process and ascites.  His Tbili and INR were near normal, AST/ALT only slightly elevated, but he had classic stigmata of chronic liver disease/portal HTN and ascites on exam.    He was admitted and underwent MRI abdomen that showed no suspicious mass but liver cirrhosis and portal hypertension.  Furthermore, he had a paracentesis of 2.7L fluid, PMNs low, gram stain negative.  He was started on spironolactone, and advised to have GI follow up in the short term.  I spoke with Dr. Leone Payor from GI who agreed to help arrange follow up with GI clinic and the patient was given [facility] contact information and discharged.   Discharge Diagnoses:  Principal Problem:   Abdominal pain Active Problems:   Cirrhosis (HCC)   Ascites   Esophageal varices (HCC)   Hyponatremia    Discharge Instructions  Discharge Instructions    Diet - low sodium heart healthy    Complete by:  As directed    Discharge instructions    Complete by:  As directed    From Dr. Maryfrances Bunnell: Start the new medicine spironolactone/Aldactone 25 mg (one tablet) daily  I sent the prescription to the [facility] on [facility] Do not drink any alcohol at all.  The following vitamins are often useful, folic acid 1 mg and thiamine 100 mg, and they were also sent to your pharmacy but are  optional.  The [facility] will call you.  These are the liver specialists.  If you have not heard from them by the end of the week, you can follow up at their main number: [phone]   Increase activity slowly    Complete by:  As directed      Allergies as of 07/17/2017   No Known Allergies     Medication List    TAKE these medications   folic acid 1 MG tablet Commonly known as:  FOLVITE Take 1 tablet (1 mg total) by mouth daily.   spironolactone 25 MG tablet Commonly known as:  ALDACTONE Take 1 tablet (25 mg total) by mouth daily.   thiamine 100 MG tablet Take 1 tablet (100 mg total) by mouth daily.       No Known Allergies  Consultations:  None   Procedures/Studies: Dg Chest 2 View  Result Date: 07/16/2017 CLINICAL DATA:  Weight loss.  Cough. EXAM: CHEST  2 VIEW COMPARISON:  None. FINDINGS: Normal heart size. Normal aortic and hilar contours. Calcified nodule in the right lung attributed to old granulomatous disease. There is no edema, consolidation, effusion, or pneumothorax. No acute osseous finding. IMPRESSION: 1. No evidence of active disease. 2. Remote granulomatous changes in the right lung. Electronically Signed   By: Marnee Spring M.D.   On: 07/16/2017 20:52   Mr Abdomen W Wo Contrast  Result Date: 07/17/2017 CLINICAL DATA:  Cirrhosis. Right-sided abdominal pain common nausea and vomiting, diarrhea, and weight loss. Liver lesion on recent CT. EXAM: MRI  ABDOMEN WITHOUT AND WITH CONTRAST TECHNIQUE: Multiplanar multisequence MR imaging of the abdomen was performed both before and after the administration of intravenous contrast. CONTRAST:  10mL MULTIHANCE GADOBENATE DIMEGLUMINE 529 MG/ML IV SOLN COMPARISON:  CT on 07/17/2017 FINDINGS: Lower chest: No acute findings. Hepatobiliary: Severe hepatic cirrhosis is demonstrated. Heterogeneous signal intensity of the hepatic parenchyma is seen, which represents a combination of confluent hepatic  fibrosis in some areas and severe hepatic steatosis in other areas. A tiny sub-cm cyst is seen in segment 4A. No evidence of hypervascular or otherwise suspicious hepatic masses. Gallbladder is unremarkable. No evidence of biliary ductal dilatation. Pancreas:  No mass or inflammatory changes. Spleen:  Within normal limits in size and appearance. Adrenals/Urinary Tract: No masses identified. Small right upper pole renal cyst noted. No evidence of hydronephrosis. Stomach/Bowel: Small hiatal hernia. Diffuse small bowel wall thickening, most likely due to hypoalbuminemia. Mild diffuse small bowel dilatation, most likely due to ileus. Vascular/Lymphatic: No pathologically enlarged lymph nodes identified. Patent portal veins. Recanalization of paraumbilical veins, esophageal varices, and left upper quadrant portosystemic venous collaterals, consistent with portal venous hypertension. No abdominal aortic aneurysm. Other:  Moderate ascites and diffuse mesenteric edema. Musculoskeletal:  No suspicious bone lesions identified. IMPRESSION: Severe hepatic cirrhosis, with areas of confluent hepatic fibrosis as well as severe steatosis. No radiographic evidence of hepatocellular carcinoma. Moderate ascites. Other findings consistent with portal venous hypertension, hypoalbuminemia, and mild ileus. Small hiatal hernia. Electronically Signed   By: Myles RosenthalJohn  Stahl M.D.   On: 07/17/2017 12:32   Ct Abdomen Pelvis W Contrast  Result Date: 07/17/2017 CLINICAL DATA:  Chronic intermittent right-sided abdominal pain and abdominal distention. Nausea, vomiting, diarrhea and weight loss. Initial encounter. EXAM: CT ABDOMEN AND PELVIS WITH CONTRAST TECHNIQUE: Multidetector CT imaging of the abdomen and pelvis was performed using the standard protocol following bolus administration of intravenous contrast. CONTRAST:  100mL ISOVUE-300 IOPAMIDOL (ISOVUE-300) INJECTION 61% COMPARISON:  None. FINDINGS: Lower chest: A small to moderate hiatal  hernia is identified. Esophageal varices are seen. The visualized portions of the mediastinum are unremarkable. The visualized lung bases are clear. Hepatobiliary: There is a very unusual poorly enhancing process involving most of the hepatic parenchyma. This predominantly does not distort the hepatic capsule and underlying vasculature, aside from masslike enlargement of the caudate lobe. Given underlying findings of hepatic cirrhosis and this appearance, confluent hepatic fibrosis is considered more likely. Malignancy cannot be entirely excluded. The gallbladder is grossly unremarkable in appearance. The common bile duct remains grossly normal in caliber. Pancreas: The pancreas is within normal limits. Spleen: The spleen is unremarkable in appearance. Adrenals/Urinary Tract: The adrenal glands are unremarkable in appearance. A small right renal cyst is noted. There is no evidence of hydronephrosis. No renal or ureteral stones are identified. No perinephric stranding is appreciated. Stomach/Bowel: The stomach is unremarkable in appearance. Gastric varices are noted. The small bowel is within normal limits. The appendix is normal in caliber, without evidence of appendicitis. Wall thickening along the entirety of the colon may reflect underlying hypoalbuminemia. Vascular/Lymphatic: Minimal calcification is noted along the distal abdominal aorta and its branches. The abdominal aorta is otherwise grossly unremarkable. The inferior vena cava is grossly unremarkable. No retroperitoneal lymphadenopathy is seen. No pelvic sidewall lymphadenopathy is identified. Reproductive: The bladder is mildly distended and grossly unremarkable. The prostate remains normal in size. Other: Moderate volume ascites is noted within the abdomen and pelvis. Musculoskeletal: No acute osseous abnormalities are identified. The visualized musculature is unremarkable in appearance. IMPRESSION: 1. Very unusual  poorly enhancing process involving most  of the hepatic parenchyma. This primarily does not distort the hepatic capsule and underlying vasculature, aside from masslike enlargement of the caudate lobe. Given underlying hepatic cirrhosis and this appearance, confluent hepatic fibrosis is considered more likely. Malignancy cannot be entirely excluded. Dynamic liver protocol MRI is recommended for further evaluation, when and as deemed clinically appropriate. 2. Moderate volume ascites, gastric varices and esophageal varices, compatible with hepatic cirrhosis. 3. Wall thickening along the entirety of the colon may reflect underlying hypoalbuminemia. 4. Small to moderate hiatal hernia noted. 5. Small right renal cyst seen. Electronically Signed   By: Roanna Raider M.D.   On: 07/17/2017 00:36   US Paracentesis  Result Date: 07/17/2017 CLINICAL DATA:  Ascites, abdominal pain EXAM: ULTRASOUND GUIDED PARACENTESIS TECHNIQUE: Survey ultrasound of the abdomen was performed and an appropriate skin entry site in the right lower abdomen was selected. Skin site was marked, prepped with chlorhexadine, draped in usual sterile fashion, and infiltrated locally with 1% lidocaine. A sheath needle was advanced into the peritoneal space until fluid could be aspirated. The sheath was advanced and the needle removed. 2.6 L of ascites were aspirated. Samples sent for the requested laboratory studies. COMPLICATIONS: COMPLICATIONS none IMPRESSION: Technically successful ultrasound guided paracentesis, removing 2.6 L ascites. Electronically Signed   By: Corlis Leak M.D.   On: 07/17/2017 13:51       Subjective: Feeling better in the abdomen.  Tearful.  Anxious.  Would like to go home.  Feels near his baseline.    Discharge Exam: Vitals:   07/17/17 1030 07/17/17 1432  BP: (!) 87/63 93/61  Pulse:  91  Resp:  15  Temp:  98.1 F (36.7 C)  SpO2:  100%   Vitals:   07/17/17 1000 07/17/17 1028 07/17/17 1030 07/17/17 1432  BP: 94/62 (!) 88/64 (!) 87/63 93/61  Pulse:     91  Resp:    15  Temp:    98.1 F (36.7 C)  TempSrc:    Oral  SpO2:    100%  Weight:      Height:        General: Very thin.  Pt is alert, awake, not in acute distress, tearful Cardiovascular: RRR, S1/S2 +, no rubs, no gallops Respiratory: CTA bilaterally, no wheezing, no rhonchi Abdominal: Soft, NT, ascites mostly resolved, ND, bowel sounds + Extremities: no peripheral edema, no cyanosis    The results of significant diagnostics from this hospitalization (including imaging, microbiology, ancillary and laboratory) are listed below for reference.     Microbiology: Recent Results (from the past 240 hour(s))  Gram stain     Status: None   Collection Time: 07/17/17 10:47 AM  Result Value Ref Range Status   Specimen Description FLUID PERITONEAL  Final   Special Requests NONE  Final   Gram Stain   Final    RARE WBC PRESENT,BOTH PMN AND MONONUCLEAR NO ORGANISMS SEEN    Report Status 07/17/2017 FINAL  Final     Labs: BNP (last 3 results) No results for input(s): BNP in the last 8760 hours. Basic Metabolic Panel:  Recent Labs Lab 07/16/17 1655 07/17/17 0704 07/17/17 1335  NA 130* 130*  --   K 3.8 2.9*  --   CL 96* 98*  --   CO2 23 19*  --   GLUCOSE 106* 72  --   BUN 7 7  --   CREATININE 0.93 0.83  --   CALCIUM 8.6* 8.0*  --  MG  --   --  1.5*   Liver Function Tests:  Recent Labs Lab 07/16/17 1655 07/17/17 0704  AST 74* 62*  ALT 42 36  ALKPHOS 145* 113  BILITOT 1.3* 1.2  PROT 6.5 5.3*  ALBUMIN 2.7* 2.3*    Recent Labs Lab 07/16/17 1655  LIPASE 69*   No results for input(s): AMMONIA in the last 168 hours. CBC:  Recent Labs Lab 07/16/17 1655 07/17/17 0704  WBC 13.7* 11.9*  HGB 13.4 11.7*  HCT 37.7* 32.9*  MCV 102.2* 101.5*  PLT 356 321   Cardiac Enzymes: No results for input(s): CKTOTAL, CKMB, CKMBINDEX, TROPONINI in the last 168 hours. BNP: Invalid input(s): POCBNP CBG: No results for input(s): GLUCAP in the last 168  hours. D-Dimer No results for input(s): DDIMER in the last 72 hours. Hgb A1c No results for input(s): HGBA1C in the last 72 hours. Lipid Profile No results for input(s): CHOL, HDL, LDLCALC, TRIG, CHOLHDL, LDLDIRECT in the last 72 hours. Thyroid function studies  Recent Labs  07/17/17 0704  TSH 2.763   Anemia work up No results for input(s): VITAMINB12, FOLATE, FERRITIN, TIBC, IRON, RETICCTPCT in the last 72 hours. Urinalysis    Component Value Date/Time   COLORURINE AMBER (A) 07/16/2017 2353   APPEARANCEUR CLOUDY (A) 07/16/2017 2353   LABSPEC 1.023 07/16/2017 2353   PHURINE 5.0 07/16/2017 2353   GLUCOSEU NEGATIVE 07/16/2017 2353   HGBUR NEGATIVE 07/16/2017 2353   BILIRUBINUR SMALL (A) 07/16/2017 2353   KETONESUR NEGATIVE 07/16/2017 2353   PROTEINUR 100 (A) 07/16/2017 2353   NITRITE NEGATIVE 07/16/2017 2353   LEUKOCYTESUR NEGATIVE 07/16/2017 2353   Sepsis Labs Invalid input(s): PROCALCITONIN,  WBC,  LACTICIDVEN Microbiology Recent Results (from the past 240 hour(s))  Gram stain     Status: None   Collection Time: 07/17/17 10:47 AM  Result Value Ref Range Status   Specimen Description FLUID PERITONEAL  Final   Special Requests NONE  Final   Gram Stain   Final    RARE WBC PRESENT,BOTH PMN AND MONONUCLEAR NO ORGANISMS SEEN    Report Status 07/17/2017 FINAL  Final     Time coordinating discharge: Over 30 minutes  SIGNED:   Alberteen Sam, MD  [facility] 07/18/2017, 10:39 AM   If 7PM-7AM, please contact night-coverage [email or web address] Password [email or web address]

## 2017-07-19 LAB — HEPATITIS PANEL, ACUTE
HCV Ab: 0.3 s/co ratio (ref 0.0–0.9)
HEP B C IGM: NEGATIVE
HEP B S AG: NEGATIVE
Hep A IgM: NEGATIVE

## 2017-07-22 LAB — CULTURE, BODY FLUID W GRAM STAIN -BOTTLE: Culture: NO GROWTH

## 2017-08-02 ENCOUNTER — Inpatient Hospital Stay (HOSPITAL_COMMUNITY)
Admission: EM | Admit: 2017-08-02 | Discharge: 2017-08-04 | DRG: 432 | Disposition: A | Discharge status: Home / Self Care | Payer: BLUE CROSS/BLUE SHIELD | Attending: Family Medicine | Admitting: Family Medicine

## 2017-08-02 ENCOUNTER — Emergency Department (HOSPITAL_COMMUNITY): Payer: BLUE CROSS/BLUE SHIELD

## 2017-08-02 ENCOUNTER — Other Ambulatory Visit: Payer: Self-pay

## 2017-08-02 ENCOUNTER — Encounter (HOSPITAL_COMMUNITY): Payer: Self-pay | Admitting: *Deleted

## 2017-08-02 DIAGNOSIS — R64 Cachexia: Secondary | ICD-10-CM | POA: Diagnosis present

## 2017-08-02 DIAGNOSIS — F1010 Alcohol abuse, uncomplicated: Secondary | ICD-10-CM | POA: Diagnosis present

## 2017-08-02 DIAGNOSIS — Z79899 Other long term (current) drug therapy: Secondary | ICD-10-CM

## 2017-08-02 DIAGNOSIS — I9589 Other hypotension: Secondary | ICD-10-CM | POA: Diagnosis not present

## 2017-08-02 DIAGNOSIS — I959 Hypotension, unspecified: Secondary | ICD-10-CM

## 2017-08-02 DIAGNOSIS — R109 Unspecified abdominal pain: Secondary | ICD-10-CM

## 2017-08-02 DIAGNOSIS — F1011 Alcohol abuse, in remission: Secondary | ICD-10-CM

## 2017-08-02 DIAGNOSIS — K766 Portal hypertension: Secondary | ICD-10-CM | POA: Diagnosis present

## 2017-08-02 DIAGNOSIS — R6881 Early satiety: Secondary | ICD-10-CM | POA: Diagnosis present

## 2017-08-02 DIAGNOSIS — F17210 Nicotine dependence, cigarettes, uncomplicated: Secondary | ICD-10-CM | POA: Diagnosis present

## 2017-08-02 DIAGNOSIS — R627 Adult failure to thrive: Secondary | ICD-10-CM | POA: Diagnosis present

## 2017-08-02 DIAGNOSIS — J90 Pleural effusion, not elsewhere classified: Secondary | ICD-10-CM | POA: Diagnosis present

## 2017-08-02 DIAGNOSIS — E43 Unspecified severe protein-calorie malnutrition: Secondary | ICD-10-CM | POA: Diagnosis present

## 2017-08-02 DIAGNOSIS — E860 Dehydration: Secondary | ICD-10-CM | POA: Diagnosis present

## 2017-08-02 DIAGNOSIS — R0682 Tachypnea, not elsewhere classified: Secondary | ICD-10-CM

## 2017-08-02 DIAGNOSIS — Z681 Body mass index (BMI) 19 or less, adult: Secondary | ICD-10-CM

## 2017-08-02 DIAGNOSIS — R188 Other ascites: Secondary | ICD-10-CM | POA: Diagnosis present

## 2017-08-02 DIAGNOSIS — K7031 Alcoholic cirrhosis of liver with ascites: Principal | ICD-10-CM | POA: Diagnosis present

## 2017-08-02 DIAGNOSIS — K7460 Unspecified cirrhosis of liver: Secondary | ICD-10-CM | POA: Diagnosis present

## 2017-08-02 HISTORY — DX: Alcoholic cirrhosis of liver without ascites: K70.30

## 2017-08-02 HISTORY — DX: Alcohol abuse, uncomplicated: F10.10

## 2017-08-02 LAB — HEPATIC FUNCTION PANEL
ALBUMIN: 2.2 g/dL — AB (ref 3.5–5.0)
ALK PHOS: 100 U/L (ref 38–126)
ALT: 20 U/L (ref 17–63)
AST: 43 U/L — ABNORMAL HIGH (ref 15–41)
BILIRUBIN DIRECT: 0.3 mg/dL (ref 0.1–0.5)
BILIRUBIN INDIRECT: 0.5 mg/dL (ref 0.3–0.9)
BILIRUBIN TOTAL: 0.8 mg/dL (ref 0.3–1.2)
Total Protein: 6.3 g/dL — ABNORMAL LOW (ref 6.5–8.1)

## 2017-08-02 LAB — CBC
HCT: 35.9 % — ABNORMAL LOW (ref 39.0–52.0)
Hemoglobin: 12.2 g/dL — ABNORMAL LOW (ref 13.0–17.0)
MCH: 35.2 pg — ABNORMAL HIGH (ref 26.0–34.0)
MCHC: 34 g/dL (ref 30.0–36.0)
MCV: 103.5 fL — AB (ref 78.0–100.0)
PLATELETS: 482 10*3/uL — AB (ref 150–400)
RBC: 3.47 MIL/uL — AB (ref 4.22–5.81)
RDW: 12.9 % (ref 11.5–15.5)
WBC: 11.7 10*3/uL — AB (ref 4.0–10.5)

## 2017-08-02 LAB — I-STAT TROPONIN, ED: TROPONIN I, POC: 0 ng/mL (ref 0.00–0.08)

## 2017-08-02 LAB — BASIC METABOLIC PANEL
Anion gap: 8 (ref 5–15)
CHLORIDE: 102 mmol/L (ref 101–111)
CO2: 23 mmol/L (ref 22–32)
CREATININE: 0.89 mg/dL (ref 0.61–1.24)
Calcium: 8.2 mg/dL — ABNORMAL LOW (ref 8.9–10.3)
Glucose, Bld: 91 mg/dL (ref 65–99)
Potassium: 3.7 mmol/L (ref 3.5–5.1)
SODIUM: 133 mmol/L — AB (ref 135–145)

## 2017-08-02 LAB — PROTIME-INR
INR: 1.13
PROTHROMBIN TIME: 14.4 s (ref 11.4–15.2)

## 2017-08-02 LAB — LIPASE, BLOOD: LIPASE: 57 U/L — AB (ref 11–51)

## 2017-08-02 LAB — LACTIC ACID, PLASMA
LACTIC ACID, VENOUS: 1.3 mmol/L (ref 0.5–1.9)
Lactic Acid, Venous: 0.9 mmol/L (ref 0.5–1.9)

## 2017-08-02 LAB — MAGNESIUM: Magnesium: 1.7 mg/dL (ref 1.7–2.4)

## 2017-08-02 LAB — PHOSPHORUS: Phosphorus: 3.9 mg/dL (ref 2.5–4.6)

## 2017-08-02 LAB — BRAIN NATRIURETIC PEPTIDE: B NATRIURETIC PEPTIDE 5: 35.2 pg/mL (ref 0.0–100.0)

## 2017-08-02 LAB — PREALBUMIN: Prealbumin: 6.6 mg/dL — ABNORMAL LOW (ref 18–38)

## 2017-08-02 LAB — AMMONIA: Ammonia: 32 umol/L (ref 9–35)

## 2017-08-02 MED ORDER — ENSURE ENLIVE PO LIQD
2.0000 | Freq: Two times a day (BID) | ORAL | Status: DC
  Administered 2017-08-02 – 2017-08-03 (×2): 474 mL via ORAL
  Filled 2017-08-02: qty 474

## 2017-08-02 MED ORDER — SODIUM CHLORIDE 0.9 % IV SOLN
INTRAVENOUS | Status: DC
  Administered 2017-08-02 – 2017-08-03 (×2): via INTRAVENOUS

## 2017-08-02 MED ORDER — TRAMADOL HCL 50 MG PO TABS: 50.0000 mg | ORAL_TABLET | Freq: Four times a day (QID) | ORAL | Status: DC | PRN

## 2017-08-02 MED ORDER — FOLIC ACID 5 MG/ML IJ SOLN
1.0000 mg | Freq: Once | INTRAMUSCULAR | Status: AC
  Administered 2017-08-02: 1 mg via INTRAVENOUS
  Filled 2017-08-02: qty 0.2

## 2017-08-02 MED ORDER — PYRIDOXINE HCL 100 MG/ML IJ SOLN
100.0000 mg | Freq: Once | INTRAMUSCULAR | Status: AC
  Administered 2017-08-02: 100 mg via INTRAVENOUS
  Filled 2017-08-02: qty 1

## 2017-08-02 MED ORDER — ALBUMIN HUMAN 25 % IV SOLN
25.0000 g | Freq: Once | INTRAVENOUS | Status: DC
  Filled 2017-08-02: qty 100

## 2017-08-02 MED ORDER — THIAMINE HCL 100 MG/ML IJ SOLN
100.0000 mg | Freq: Once | INTRAMUSCULAR | Status: AC
  Administered 2017-08-02: 100 mg via INTRAVENOUS
  Filled 2017-08-02: qty 2

## 2017-08-02 MED ORDER — SPIRONOLACTONE 25 MG PO TABS
50.0000 mg | ORAL_TABLET | Freq: Every day | ORAL | Status: DC
  Administered 2017-08-03 – 2017-08-04 (×2): 50 mg via ORAL
  Filled 2017-08-02 (×2): qty 2

## 2017-08-02 MED ORDER — HEPARIN SODIUM (PORCINE) 5000 UNIT/ML IJ SOLN
5000.0000 [IU] | Freq: Three times a day (TID) | INTRAMUSCULAR | Status: DC
  Administered 2017-08-03 – 2017-08-04 (×2): 5000 [IU] via SUBCUTANEOUS
  Filled 2017-08-02 (×2): qty 1

## 2017-08-02 MED ORDER — ONDANSETRON HCL 4 MG/2ML IJ SOLN: 4.0000 mg | Freq: Four times a day (QID) | INTRAMUSCULAR | Status: DC | PRN

## 2017-08-02 MED ORDER — CEFTRIAXONE SODIUM 2 G IJ SOLR
2.0000 g | INTRAMUSCULAR | Status: DC
  Administered 2017-08-02 – 2017-08-03 (×2): 2 g via INTRAVENOUS
  Filled 2017-08-02 (×3): qty 2

## 2017-08-02 MED ORDER — CYANOCOBALAMIN 1000 MCG/ML IJ SOLN
1000.0000 ug | Freq: Once | INTRAMUSCULAR | Status: AC
  Administered 2017-08-02: 1000 ug via INTRAMUSCULAR
  Filled 2017-08-02: qty 1

## 2017-08-02 MED ORDER — ONDANSETRON HCL 4 MG PO TABS: 4.0000 mg | ORAL_TABLET | Freq: Four times a day (QID) | ORAL | Status: DC | PRN

## 2017-08-02 NOTE — ED Notes (Signed)
Attempted report. RN busy with another patient. Will call me back shortly.

## 2017-08-02 NOTE — H&P (Signed)
History and Physical    John ChesterfieldRobert Murphy WUJ:[id number] DOB: May 07, 1961 DOA: 08/02/2017  PCP: Patient, No Pcp Per Patient coming from: home  Chief Complaint: weakness and sob  HPI: John ChesterfieldRobert Murphy is a 56 y.o. male with medical history significant of ETOH abuse and cirrhosis.   Patient presents with two-week history of gradual worsening abdominal distention, tenderness and shortness of breath. Problem is constant. Shortness breath is worse with laying down flat. Patient states that he "feels like his abdomen is swollen to the level of my  neck." Intermittent subjective fevers. Patient unable to lie on his side due to tenderness of abdomen. Intermittent confusion over the last 2-3 months which is essentially unchanged since onset. Currently mental status seems to be at baseline per patient and patient's live-in girlfriend. Patient states that he has not had any alcohol since his previous admission. Patient is taking his spironolactone as prescribed at time of discharge without any increased urinary output. Patient reports ongoing diarrhea, approximately 3 episodes per day for several weeks.  ED Course: Albumin given.  Review of Systems: As per HPI otherwise all other systems reviewed and are negative  Ambulatory Status: worseing physical condition resulting in worsening ability to perform ADLs  Past Medical History:  Diagnosis Date  . Cirrhosis, alcoholic (HCC)   . ETOH abuse     Past Surgical History:  Procedure Laterality Date  . NO PAST SURGERIES      Social History   Socioeconomic History  . Marital status: Single    Spouse name: Not on file  . Number of children: Not on file  . Years of education: Not on file  . Highest education level: Not on file  Social Needs  . Financial resource strain: Not on file  . Food insecurity - worry: Not on file  . Food insecurity - inability: Not on file  . Transportation needs - medical: Not on file  . Transportation needs - non-medical:  Not on file  Occupational History  . Not on file  Tobacco Use  . Smoking status: Current Every Day Smoker    Packs/day: 2.00  . Smokeless tobacco: Never Used  Substance and Sexual Activity  . Alcohol use: Yes  . Drug use: No  . Sexual activity: Not on file  Other Topics Concern  . Not on file  Social History Narrative  . Not on file    No Known Allergies  Family History  Family history unknown: Yes    Asked during admission to clarify and pt states his parents are living but have no medical problems and no known prblems in the family.   Prior to Admission medications   Medication Sig Start Date End Date Taking? Authorizing Provider  folic acid (FOLVITE) 1 MG tablet Take 1 tablet (1 mg total) by mouth daily. 07/18/17  Yes Danford, Earl Liteshristopher P, MD  spironolactone (ALDACTONE) 25 MG tablet Take 1 tablet (25 mg total) by mouth daily. 07/17/17 07/17/18 Yes Danford, Earl Liteshristopher P, MD  thiamine 100 MG tablet Take 1 tablet (100 mg total) by mouth daily. 07/18/17  Yes Alberteen Samanford, Christopher P, MD    Physical Exam: Vitals:   08/02/17 1515 08/02/17 1530 08/02/17 1545 08/02/17 1600  BP: (!) 85/60 (!) 86/65 90/63 90/62   Pulse: 84 84 89 85  Resp: 14 (!) 26 17 17   Temp:      TempSrc:      SpO2: 99% 98% 98% 98%  Weight:      Height:  General: Frail and cachectic appearing, lying comfortably in bed. Eyes:  PERRL, EOMI, normal lids, iris ENT:  grossly normal hearing, lips & tongue, mmm Neck:  no LAD, masses or thyromegaly Cardiovascular: Faint heart sounds, regular rate and rhythm,. No LE edema.  Respiratory:  CTA bilaterally, no w/r/r. Normal respiratory effort. Abdomen: Distended, mildly tender to palpation, hypoactive bowel sounds Skin:  no rash or induration seen on limited exam Musculoskeletal:  grossly diminished tone throughout bilateral upper extremity is in bilateral lower extremities good ROM, no bony abnormality Psychiatric:  grossly normal mood and affect, speech  fluent and appropriate, AOx3 Neurologic:  CN 2-12 grossly intact, moves all extremities in coordinated fashion, sensation intact  Labs on Admission: I have personally reviewed following labs and imaging studies  CBC: Recent Labs  Lab 08/02/17 0721  WBC 11.7*  HGB 12.2*  HCT 35.9*  MCV 103.5*  PLT 482*   Basic Metabolic Panel: Recent Labs  Lab 08/02/17 0721  NA 133*  K 3.7  CL 102  CO2 23  GLUCOSE 91  BUN <5*  CREATININE 0.89  CALCIUM 8.2*   GFR: Estimated Creatinine Clearance: 59.5 mL/min (by C-G formula based on SCr of 0.89 mg/dL). Liver Function Tests: Recent Labs  Lab 08/02/17 0808  AST 43*  ALT 20  ALKPHOS 100  BILITOT 0.8  PROT 6.3*  ALBUMIN 2.2*   Recent Labs  Lab 08/02/17 0808  LIPASE 57*   No results for input(s): AMMONIA in the last 168 hours. Coagulation Profile: Recent Labs  Lab 08/02/17 0808  INR 1.13   Cardiac Enzymes: No results for input(s): CKTOTAL, CKMB, CKMBINDEX, TROPONINI in the last 168 hours. BNP (last 3 results) No results for input(s): PROBNP in the last 8760 hours. HbA1C: No results for input(s): HGBA1C in the last 72 hours. CBG: No results for input(s): GLUCAP in the last 168 hours. Lipid Profile: No results for input(s): CHOL, HDL, LDLCALC, TRIG, CHOLHDL, LDLDIRECT in the last 72 hours. Thyroid Function Tests: No results for input(s): TSH, T4TOTAL, FREET4, T3FREE, THYROIDAB in the last 72 hours. Anemia Panel: No results for input(s): VITAMINB12, FOLATE, FERRITIN, TIBC, IRON, RETICCTPCT in the last 72 hours. Urine analysis:    Component Value Date/Time   COLORURINE AMBER (A) 07/16/2017 2353   APPEARANCEUR CLOUDY (A) 07/16/2017 2353   LABSPEC 1.023 07/16/2017 2353   PHURINE 5.0 07/16/2017 2353   GLUCOSEU NEGATIVE 07/16/2017 2353   HGBUR NEGATIVE 07/16/2017 2353   BILIRUBINUR SMALL (A) 07/16/2017 2353   KETONESUR NEGATIVE 07/16/2017 2353   PROTEINUR 100 (A) 07/16/2017 2353   NITRITE NEGATIVE 07/16/2017 2353    LEUKOCYTESUR NEGATIVE 07/16/2017 2353    Creatinine Clearance: Estimated Creatinine Clearance: 59.5 mL/min (by C-G formula based on SCr of 0.89 mg/dL).  Sepsis Labs: @LABRCNTIP (procalcitonin:4,lacticidven:4) )No results found for this or any previous visit (from the past 240 hour(s)).   Radiological Exams on Admission: Dg Chest 2 View  Result Date: 08/02/2017 CLINICAL DATA:  Exertional shortness of breath, orthopnea, cramping sensation in the chest. Sensation of fluid in the lungs. History of cirrhosis with portal hypertension, current smoker. EXAM: CHEST  2 VIEW COMPARISON:  Chest x-ray of July 16, 2017 FINDINGS: The lungs are well-expanded. There is new patchy density at the left base partially obscuring the hemidiaphragm. A small amount of pleural fluid blunts the costophrenic angles. There is a stable densely calcified nodule measuring approximately 11 mm in diameter in the inferior a lateral aspect of the right upper lobe. The heart and pulmonary vascularity are normal.  There is calcification in the wall of the aortic arch. The bony thorax exhibits no acute abnormality. IMPRESSION: Small left pleural effusion with subsegmental atelectasis. No pulmonary vascular congestion. 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 Thoracic aortic atherosclerosis. Electronically Signed   By: David  [place] M.D.   On: 08/02/2017 07:44    EKG: Independently reviewed. Sinus. No ACS  Assessment/Plan Active Problems:   Abdominal pain   Cirrhosis (HCC)   Ascites   Hypotension   FTT (failure to thrive) in adult   History of alcohol abuse   Cirrhosis w/ symptomatic ascites: MELD score 8. ETOH induced cirhosis. Recent admission for the same. Pt states compliance w/ spirnoloactone since time of last discharge. EDP ordered US paracentesis by IR. Albumin given in ED due to steady SBP in the 80-90s.  - IR set to perform paracentesis on 08/03/17 - IVF for pressure support - No further  albumin - Ammonia - Ceftriaxone for possible SBP. Low threshold for stopping especially if paracentesis analysis reasuring.  - Increase spironolactone to 50mg  Qday - +/- palliative care consult for GOC  Hypotension: Secondary to progressive cirrhosis, severe dehydration and possible SBP.  - IVF  FTT: pt w/ apparent diminished will/drive to live. States he has adequate apetite, capable of swallowing, no n/v, but simply decides to spit out his food during meals. This has come on since his Dx of cirrhosis. Pt appears apathetic in general when discussing his current disease process and prognosis - Psych consult for recs for antidepressants vs outpt f/u  - Prealbumin, nutrition consult, PT, OT.   ETOH abuse: last ETOH drink >2 wks. No signs of withdrawal.  - Monitor   DVT prophylaxis: Hep  Code Status: full  Family Communication: common law wife  Disposition Plan: pending improvement in overall condition includnigparacentesis  Consults called: IR  Admission status: inpt.     Ozella Rocks MD [facility]  If 7PM-7AM, please contact night-coverage [email or web address] Password [email or web address]  08/02/2017, 5:03 PM

## 2017-08-02 NOTE — ED Provider Notes (Signed)
[facility] Provider Note   CSN: [id number] Arrival date & time: 08/02/17  0707     History   Chief Complaint Chief Complaint  Patient presents with  . Shortness of Breath    HPI Rebekah ChesterfieldRobert Kneale is a 56 y.o. male.  Who presents the emergency department chief complaint of shortness of breath.  The patient was seen in the emergency department on 07/16/2017 and diagnosed with ascites.  Workup in the hospital showed new diagnosis of cirrhosis with portal venous hypertension.  Patient had almost 3 L of ascites drawn off of his abdomen.  He has a history of chronic alcohol abuse and smoking.  The patient states that since discharge about 2 days after leaving he started developing swelling in his abdomen again.  He states that he also has swelling in his ankles and feels like he is having difficulty laying down and breathing.  He complains of tightness across his abdomen but denies any pain.  [facility] but states that he was never contacted and has not followed up.  He was seen by Dr. Leone PayorGessner during his visit.  Patient states that he has not had any alcohol over the past week.  He states that prior to his previous admission he weighed about 125 pounds but has lost significant weight.  He states that he has early satiety and can only eat a small amount.  He denies fevers, chills, abdominal pain, chest pain.  He denies exertional dyspnea or exertional chest pain.  He does admit to significant weakness.  His wife states that sometimes she has to help him walk because he is so weak. HPI  History reviewed. No pertinent past medical history.  Patient Active Problem List   Diagnosis Date Noted  . Abdominal pain 07/17/2017  . Cirrhosis (HCC) 07/17/2017  . Ascites 07/17/2017  . Esophageal varices (HCC) 07/17/2017  . Hyponatremia 07/17/2017    History reviewed. No pertinent surgical history.     Home  Medications    Prior to Admission medications   Medication Sig Start Date End Date Taking? Authorizing Provider  folic acid (FOLVITE) 1 MG tablet Take 1 tablet (1 mg total) by mouth daily. 07/18/17  Yes Danford, Earl Liteshristopher P, MD  spironolactone (ALDACTONE) 25 MG tablet Take 1 tablet (25 mg total) by mouth daily. 07/17/17 07/17/18 Yes Danford, Earl Liteshristopher P, MD  thiamine 100 MG tablet Take 1 tablet (100 mg total) by mouth daily. 07/18/17  Yes Danford, Earl Liteshristopher P, MD    Family History No family history on file.  Social History Social History   Tobacco Use  . Smoking status: Current Every Day Smoker    Packs/day: 2.00  . Smokeless tobacco: Never Used  Substance Use Topics  . Alcohol use: Yes  . Drug use: No     Allergies   Patient has no known allergies.   Review of Systems Review of Systems  Constitutional: Positive for activity change, appetite change, fatigue and unexpected weight change. Negative for chills and fever.  HENT: Negative.   Eyes: Negative.   Respiratory: Positive for shortness of breath.   Cardiovascular: Positive for leg swelling.  Gastrointestinal: Positive for abdominal distention. Negative for abdominal pain, blood in stool and vomiting.  Endocrine: Negative.   Genitourinary: Negative.   Musculoskeletal: Positive for gait problem.  Skin: Negative for color change.  Allergic/Immunologic: Negative.   Hematological: Negative.   Psychiatric/Behavioral: Negative.   All other systems reviewed and are  negative.     Physical Exam Updated Vital Signs BP 97/78   Pulse 78   Temp 98.1 F (36.7 C) (Oral)   Resp 13   Ht 5\' 8"  (1.727 m)   Wt 45.4 kg (100 lb)   SpO2 99%   BMI 15.20 kg/m   Physical Exam  Constitutional: He is oriented to person, place, and time. He appears cachectic. He has a sickly appearance.  Patient appears chronically ill, cachectic, slow of speech and weak. He has marked protein calorie malnutrition and muscle atrophy  HENT:    Head: Normocephalic and atraumatic.  Mouth/Throat: Oropharynx is clear and moist.  Cardiovascular: Normal rate and regular rhythm. Exam reveals decreased pulses.  Pulmonary/Chest: No accessory muscle usage. No respiratory distress. He has decreased breath sounds in the right lower field and the left lower field. He has no wheezes. He has no rales.  Increased effort secondary to breathing against tight and distended abdomen  Abdominal: He exhibits distension and ascites. There is no tenderness.  Musculoskeletal:       Right lower leg: He exhibits edema. He exhibits no tenderness.       Left lower leg: He exhibits edema. He exhibits no tenderness.  Neurological: He is alert and oriented to person, place, and time.  Skin: Skin is warm and dry. Capillary refill takes less than 2 seconds.  Nursing note and vitals reviewed.    ED Treatments / Results  Labs (all labs ordered are listed, but only abnormal results are displayed) Labs Reviewed  BASIC METABOLIC PANEL - Abnormal; Notable for the following components:      Result Value   Sodium 133 (*)    BUN <5 (*)    Calcium 8.2 (*)    All other components within normal limits  CBC - Abnormal; Notable for the following components:   WBC 11.7 (*)    RBC 3.47 (*)    Hemoglobin 12.2 (*)    HCT 35.9 (*)    MCV 103.5 (*)    MCH 35.2 (*)    Platelets 482 (*)    All other components within normal limits  PROTIME-INR  BRAIN NATRIURETIC PEPTIDE  HEPATIC FUNCTION PANEL  LIPASE, BLOOD  I-STAT TROPONIN, ED    EKG  EKG Interpretation  Date/Time:  Monday August 02 2017 07:25:34 EST Ventricular Rate:  84 PR Interval:    QRS Duration: 93 QT Interval:  401 QTC Calculation: 474 R Axis:   86 Text Interpretation:  Sinus rhythm Low voltage, extremity and precordial leads No old tracing to compare No apparent ST elevations or depressions Confirmed by Shaune PollackIsaacs, Cameron [phone]([id number]) on 08/02/2017 7:37:20 AM       Radiology Dg Chest 2  View  Result Date: 08/02/2017 CLINICAL DATA:  Exertional shortness of breath, orthopnea, cramping sensation in the chest. Sensation of fluid in the lungs. History of cirrhosis with portal hypertension, current smoker. EXAM: CHEST  2 VIEW COMPARISON:  Chest x-ray of July 16, 2017 FINDINGS: The lungs are well-expanded. There is new patchy density at the left base partially obscuring the hemidiaphragm. A small amount of pleural fluid blunts the costophrenic angles. There is a stable densely calcified nodule measuring approximately 11 mm in diameter in the inferior a lateral aspect of the right upper lobe. The heart and pulmonary vascularity are normal. There is calcification in the wall of the aortic arch. The bony thorax exhibits no acute abnormality. IMPRESSION: Small left pleural effusion with subsegmental atelectasis. No pulmonary vascular congestion. Previous granulomatous infection  in the right lung, stable. Thoracic aortic atherosclerosis. Electronically Signed   By: David  [place] M.D.   On: 08/02/2017 07:44    Procedures Procedures (including critical care time)  Medications Ordered in ED Medications  folic acid injection 1 mg (not administered)  thiamine (B-1) injection 100 mg (100 mg Intravenous Given 08/02/17 0846)  pyridOXINE (B-6) injection 100 mg (100 mg Intravenous Given 08/02/17 0848)  cyanocobalamin ((VITAMIN B-12)) injection 1,000 mcg (1,000 mcg Intramuscular Given 08/02/17 0850)     Initial Impression / Assessment and Plan / ED Course  I have reviewed the triage vital signs and the nursing notes.  Pertinent labs & imaging results that were available during my care of the patient were reviewed by me and considered in my medical decision making (see chart for details).  Clinical Course as of Aug 02 938  [facility] Aug 02, 2017  0930 Lipase: Nehemiah Settle [AH]    Clinical Course User Index [AH] Arthor Captain, PA-C   Patient unable to obtain therapeutic paracentesis in timely  manner. He will be admitted by the hospitalist service and expect paracentesis tomorrow.   Final Clinical Impressions(s) / ED Diagnoses   Final diagnoses:  Ascites  Other specified hypotension  Tachypnea  Pleural effusion  FTT (failure to thrive) in adult    ED Discharge Orders    None       Arthor Captain, PA-C 08/02/17 1608    Shaune Pollack, MD 08/02/17 1819

## 2017-08-02 NOTE — ED Notes (Signed)
IR called to advise pt couldn't make their schedule today but they will keep him on the schedule for tomorrow.  Cammy CopaAbigail, PA notified.

## 2017-08-02 NOTE — ED Triage Notes (Signed)
Pt states that he has "fluid in my lungs". Pt reports that he has sob with walking and laying. Pt reports a cramping in his chest. Pt states that he was seen recently for the same

## 2017-08-02 NOTE — ED Notes (Signed)
Pt has assigned room ready. Will call report shortly.

## 2017-08-02 NOTE — ED Notes (Signed)
2 vanilla ensure's ordered from pharmacy

## 2017-08-02 NOTE — ED Notes (Signed)
Attempting report

## 2017-08-02 NOTE — ED Notes (Signed)
Left message at [phone] for Eulas Postrystal Garrett IR coordinator regarding pt status or timeframe.

## 2017-08-03 ENCOUNTER — Encounter (HOSPITAL_COMMUNITY): Payer: Self-pay | Admitting: Student

## 2017-08-03 ENCOUNTER — Inpatient Hospital Stay (HOSPITAL_COMMUNITY): Payer: BLUE CROSS/BLUE SHIELD

## 2017-08-03 DIAGNOSIS — J90 Pleural effusion, not elsewhere classified: Secondary | ICD-10-CM

## 2017-08-03 HISTORY — PX: IR PARACENTESIS: IMG2679

## 2017-08-03 LAB — CBC
HEMATOCRIT: 30.9 % — AB (ref 39.0–52.0)
Hemoglobin: 10.7 g/dL — ABNORMAL LOW (ref 13.0–17.0)
MCH: 35.8 pg — AB (ref 26.0–34.0)
MCHC: 34.6 g/dL (ref 30.0–36.0)
MCV: 103.3 fL — AB (ref 78.0–100.0)
PLATELETS: 427 10*3/uL — AB (ref 150–400)
RBC: 2.99 MIL/uL — ABNORMAL LOW (ref 4.22–5.81)
RDW: 13 % (ref 11.5–15.5)
WBC: 10.7 10*3/uL — ABNORMAL HIGH (ref 4.0–10.5)

## 2017-08-03 LAB — COMPREHENSIVE METABOLIC PANEL
ALT: 16 U/L — AB (ref 17–63)
AST: 38 U/L (ref 15–41)
Albumin: 1.8 g/dL — ABNORMAL LOW (ref 3.5–5.0)
Alkaline Phosphatase: 86 U/L (ref 38–126)
Anion gap: 5 (ref 5–15)
BILIRUBIN TOTAL: 0.8 mg/dL (ref 0.3–1.2)
BUN: 5 mg/dL — AB (ref 6–20)
CO2: 24 mmol/L (ref 22–32)
CREATININE: 0.82 mg/dL (ref 0.61–1.24)
Calcium: 7.9 mg/dL — ABNORMAL LOW (ref 8.9–10.3)
Chloride: 105 mmol/L (ref 101–111)
GFR calc Af Amer: >60 mL/min (ref >60)
Glucose, Bld: 95 mg/dL (ref 65–99)
POTASSIUM: 3.3 mmol/L — AB (ref 3.5–5.1)
Sodium: 134 mmol/L — ABNORMAL LOW (ref 135–145)
TOTAL PROTEIN: 5.2 g/dL — AB (ref 6.5–8.1)

## 2017-08-03 LAB — MRSA PCR SCREENING: MRSA BY PCR: NEGATIVE

## 2017-08-03 LAB — MAGNESIUM: Magnesium: 1.6 mg/dL — ABNORMAL LOW (ref 1.7–2.4)

## 2017-08-03 LAB — PHOSPHORUS: Phosphorus: 3.5 mg/dL (ref 2.5–4.6)

## 2017-08-03 MED ORDER — SODIUM CHLORIDE 0.9 % IV BOLUS (SEPSIS)
500.0000 mL | Freq: Once | INTRAVENOUS | Status: AC
  Administered 2017-08-03: 500 mL via INTRAVENOUS

## 2017-08-03 MED ORDER — LIDOCAINE 2% (20 MG/ML) 5 ML SYRINGE
INTRAMUSCULAR | Status: AC
  Filled 2017-08-03: qty 5

## 2017-08-03 MED ORDER — ENSURE ENLIVE PO LIQD
237.0000 mL | Freq: Four times a day (QID) | ORAL | Status: DC
  Administered 2017-08-03 – 2017-08-04 (×2): 237 mL via ORAL

## 2017-08-03 MED ORDER — LIDOCAINE HCL (PF) 1 % IJ SOLN
INTRAMUSCULAR | Status: DC | PRN
  Administered 2017-08-03 (×2): 10 mL

## 2017-08-03 NOTE — Progress Notes (Signed)
OT Cancellation Note  Patient Details Name: John ChesterfieldRobert Murphy MRN: [id number] DOB: November 13, 1960   Cancelled Treatment:    Reason Eval/Treat Not Completed: Patient at procedure or test/ unavailable. Pt off unit for procedure at this time. Will check back as able to initiate evaluation.   Doristine Sectionharity A Lille Karim, MS OTR/L  Pager: [phone]   Adria Costley A Nasha Diss 08/03/2017, 12:02 PM

## 2017-08-03 NOTE — Progress Notes (Signed)
PT Cancellation Note  Patient Details Name: Rebekah ChesterfieldRobert Borawski MRN: [id number] DOB: 09-07-1961   Cancelled Treatment:    Reason Eval/Treat Not Completed: Patient at procedure or test/unavailable. Will follow-up for PT evaluation as schedule permits.  Ina HomesJaclyn Kona Lover, PT, DPT Acute Rehab Services  Pager: [phone]  Malachy ChamberJaclyn L Wladyslaw Henrichs 08/03/2017, 12:00 PM

## 2017-08-03 NOTE — Progress Notes (Signed)
BP 86/66.  Pt A&Ox4 with mentation at baseline.  Pt denies dizziness, SOB, or feelings of syncope/presyncope.  Pt does state "My blood pressure does run low sometimes and I get dizzy if I move too fast."  Pt SBP noted to be 90's-100's upon chart review.  Clance Boll. Bodenheimer, NP notified of BP.  Order received for NS bolus.  Bolus given per order.

## 2017-08-03 NOTE — Progress Notes (Signed)
PROGRESS NOTE    Rebekah ChesterfieldRobert Tawney  WGN:[id number] DOB: 03/15/1961 DOA: 08/02/2017 PCP: Patient, No Pcp Per   Brief Narrative:  56 y.o. male with medical history significant of ETOH abuse and cirrhosis.   Patient presents with two-week history of gradual worsening abdominal distention, tenderness and shortness of breath  Pt diagnosed with Cirrhosis with symptomatic ascites and covered with antibiotics for possible SBP (low index of suspicion however. US paracentesis by IR completed.  Assessment & Plan:   Active Problems:   Abdominal pain - secondary to Cirrhosis - US ordered    Cirrhosis (HCC)/Ascites - most likely cause of principle problem - I have placed order for body fluid culture and analysis with cell count and differential  - Will continue antibiotics for now with low threshold to discontinue    Hypotension - resolved, patient required albumin.     FTT (failure to thrive) in adult - secondary to liver cirrhosis at this point. Improving currently   DVT prophylaxis: Heparin Code Status: Full Family Communication: none at bedside. Disposition Plan:    Consultants:   IR for procedure below.   Procedures: U/S guided paracentesis   Antimicrobials: Rocephin   Subjective: Pt has no new complaints  Objective: Vitals:   08/03/17 0800 08/03/17 1143 08/03/17 1229 08/03/17 1235  BP: 109/75 109/75 92/69   Pulse: 87     Resp: 16     Temp: 98.9 F (37.2 C)   98.3 F (36.8 C)  TempSrc: Oral   Oral  SpO2: 99%     Weight:      Height:        Intake/Output Summary (Last 24 hours) at 08/03/2017 1515 Last data filed at 08/03/2017 1200 Gross per 24 hour  Intake 660 ml  Output 3200 ml  Net -2540 ml   Filed Weights   08/02/17 0721 08/02/17 2123  Weight: 45.4 kg (100 lb) 47.7 kg (105 lb 1.6 oz)    Examination:  General exam: Appears calm and comfortable, in nad. Respiratory system: Clear to auscultation. Respiratory effort normal. Equal chest rise.    Cardiovascular system: S1 & S2 heard, RRR. No JVD, murmurs, rubs, gallops or clicks.  Gastrointestinal system: Abdomen is distended, soft and nontender.  Central nervous system: Alert and oriented. No focal neurological deficits. Extremities: Symmetric 5 x 5 power. Skin: No rashes, lesions or ulcers, on limited exam. Psychiatry:  Mood & affect appropriate.   Data Reviewed: I have personally reviewed following labs and imaging studies  CBC: Recent Labs  Lab 08/02/17 0721 08/03/17 0236  WBC 11.7* 10.7*  HGB 12.2* 10.7*  HCT 35.9* 30.9*  MCV 103.5* 103.3*  PLT 482* 427*   Basic Metabolic Panel: Recent Labs  Lab 08/02/17 0721 08/02/17 1650 08/03/17 0236  NA 133*  --  134*  K 3.7  --  3.3*  CL 102  --  105  CO2 23  --  24  GLUCOSE 91  --  95  BUN <5*  --  5*  CREATININE 0.89  --  0.82  CALCIUM 8.2*  --  7.9*  MG  --  1.7 1.6*  PHOS  --  3.9 3.5   GFR: Estimated Creatinine Clearance: 67.9 mL/min (by C-G formula based on SCr of 0.82 mg/dL). Liver Function Tests: Recent Labs  Lab 08/02/17 0808 08/03/17 0236  AST 43* 38  ALT 20 16*  ALKPHOS 100 86  BILITOT 0.8 0.8  PROT 6.3* 5.2*  ALBUMIN 2.2* 1.8*   Recent Labs  Lab 08/02/17 [id number]  LIPASE 57*   Recent Labs  Lab 08/02/17 1650  AMMONIA 32   Coagulation Profile: Recent Labs  Lab 08/02/17 0808  INR 1.13   Cardiac Enzymes: No results for input(s): CKTOTAL, CKMB, CKMBINDEX, TROPONINI in the last 168 hours. BNP (last 3 results) No results for input(s): PROBNP in the last 8760 hours. HbA1C: No results for input(s): HGBA1C in the last 72 hours. CBG: No results for input(s): GLUCAP in the last 168 hours. Lipid Profile: No results for input(s): CHOL, HDL, LDLCALC, TRIG, CHOLHDL, LDLDIRECT in the last 72 hours. Thyroid Function Tests: No results for input(s): TSH, T4TOTAL, FREET4, T3FREE, THYROIDAB in the last 72 hours. Anemia Panel: No results for input(s): VITAMINB12, FOLATE, FERRITIN, TIBC, IRON,  RETICCTPCT in the last 72 hours. Sepsis Labs: Recent Labs  Lab 08/02/17 1644 08/02/17 2130  LATICACIDVEN 0.9 1.3    Recent Results (from the past 240 hour(s))  MRSA PCR Screening     Status: None   Collection Time: 08/02/17  9:27 PM  Result Value Ref Range Status   MRSA by PCR NEGATIVE NEGATIVE Final    Comment:        The GeneXpert MRSA Assay (FDA approved for NASAL specimens only), is one component of a comprehensive MRSA colonization surveillance program. It is not intended to diagnose MRSA infection nor to guide or monitor treatment for MRSA infections.          Radiology Studies: Dg Chest 2 View  Result Date: 08/02/2017 CLINICAL DATA:  Exertional shortness of breath, orthopnea, cramping sensation in the chest. Sensation of fluid in the lungs. History of cirrhosis with portal hypertension, current smoker. EXAM: CHEST  2 VIEW COMPARISON:  Chest x-ray of July 16, 2017 FINDINGS: The lungs are well-expanded. There is new patchy density at the left base partially obscuring the hemidiaphragm. A small amount of pleural fluid blunts the costophrenic angles. There is a stable densely calcified nodule measuring approximately 11 mm in diameter in the inferior a lateral aspect of the right upper lobe. The heart and pulmonary vascularity are normal. There is calcification in the wall of the aortic arch. The bony thorax exhibits no acute abnormality. IMPRESSION: Small left pleural effusion with subsegmental atelectasis. No pulmonary vascular congestion. 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 Thoracic aortic atherosclerosis. Electronically Signed   By: David  SwazilandJordan M.D.   On: 08/02/2017 07:44   Ir Paracentesis  Result Date: 08/03/2017 INDICATION: Patient with history recurrent ascites. Request is made for therapeutic paracentesis of up to 5 liters maximum. EXAM: ULTRASOUND GUIDED DIAGNOSTIC AND THERAPEUTIC PARACENTESIS MEDICATIONS: 10 mL 2% lidocaine  COMPLICATIONS: None immediate. PROCEDURE: Informed written consent was obtained from the patient after a discussion of the risks, benefits and alternatives to treatment. A timeout was performed prior to the initiation of the procedure. Initial ultrasound scanning demonstrates a large amount of ascites within the right lateral abdomen. The right lateral abdomen was prepped and draped in the usual sterile fashion. 2% lidocaine was used for local anesthesia. Following this, a 19 gauge, 7-cm, Yueh catheter was introduced. An ultrasound image was saved for documentation purposes. The paracentesis was performed. The catheter was removed and a dressing was applied. The patient tolerated the procedure well without immediate post procedural complication. FINDINGS: A total of approximately 3.2 liters of yellow, turbid fluid was removed. IMPRESSION: Successful ultrasound-guided therapeutic paracentesis yielding 3.2 liters of peritoneal fluid. Read by:  Loyce DysKacie Matthews PA-C Electronically Signed   By: Richarda OverlieAdam  Henn M.D.   On: 08/03/2017 12:48  Scheduled Meds: . lidocaine      . feeding supplement (ENSURE ENLIVE)  2 Bottle Oral BID BM  . heparin  5,000 Units Subcutaneous Q8H  . spironolactone  50 mg Oral Daily   Continuous Infusions: . sodium chloride 75 mL/hr at 08/03/17 0402  . cefTRIAXone (ROCEPHIN)  IV Stopped (08/02/17 0981)     LOS: 1 day    Time spent: > 35 minutes  Penny Pia, MD [facility] Pager [phone]  If 7PM-7AM, please contact night-coverage [email or web address] Password [email or web address] 08/03/2017, 3:15 PM

## 2017-08-03 NOTE — Procedures (Signed)
PROCEDURE SUMMARY:  Successful US guided paracentesis from right lateral abdomen.  Yielded 3.2 liters of yellow, turbid fluid.  No immediate complications.  Pt tolerated well.   Specimen was not sent for labs.  Hoyt KochKacie Sue-Ellen Trenell Moxey PA-C 08/03/2017 12:20 PM

## 2017-08-03 NOTE — Progress Notes (Signed)
PT Cancellation Note  Patient Details Name: John ChesterfieldRobert Wolpert MRN: [id number] DOB: 06/12/1961   Cancelled Treatment:    Reason Eval/Treat Not Completed: Other (comment). 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 States he will have 24/7 assist from partner at home. PT will sign-off.  Ina HomesJaclyn Bleu Moisan, PT, DPT Acute Rehab Services  Pager: [phone]  Malachy ChamberJaclyn L Graham Doukas 08/03/2017, 2:02 PM

## 2017-08-03 NOTE — Progress Notes (Signed)
Initial Nutrition Assessment  DOCUMENTATION CODES:   Underweight, Severe malnutrition in context of chronic illness  INTERVENTION:   -Ensure Enlive po QID, each supplement provides 350 kcal and 20 grams of protein  NUTRITION DIAGNOSIS:   Severe Malnutrition related to chronic illness(cirrhosis) as evidenced by severe fat depletion, severe muscle depletion, energy intake < 75% for > or equal to 1 month.  GOAL:   Patient will meet greater than or equal to 90% of their needs  MONITOR:   PO intake, Supplement acceptance, Labs, Weight trends, Skin, I & O's  REASON FOR ASSESSMENT:   Malnutrition Screening Tool    ASSESSMENT:   John ChesterfieldRobert Murphy is a 56 y.o. male with medical history significant of ETOH abuse and cirrhosis. Pt admitted with cirrhosis with symptomatic ascites.   Case discussed with RN prior to visit, who reports pt has little food intake. He is consuming liquids such as sodas and Ensure well.   Pt underwent thoracentesis today; yielded 3.2 L of fluid.   Spoke with pt at bedside. He reports poor oral intake for at least one month related to early satiety. He attempts to consume 3 meals per day (grits and eggs, sandwiches at lunch and dinner). Pt also consumes 3-4 Ensure supplements daily at home.   Pt shares UBW is around 125#. However, no wt hx available to assess changes.   Pt already consumed Ensure supplement. Pt reports he is hungry and awaiting lunch (cheeseburger and fries). Discussed importance of good meal and supplement intake to promote healing.   Labs reviewed: Na: 134 (on IV supplementation), K: 3.3, Mg: 1.6  NUTRITION - FOCUSED PHYSICAL EXAM:    Most Recent Value  Orbital Region  Moderate depletion  Upper Arm Region  Severe depletion  Thoracic and Lumbar Region  Severe depletion  Buccal Region  Severe depletion  Temple Region  Moderate depletion  Clavicle Bone Region  Severe depletion  Clavicle and Acromion Bone Region  Severe depletion   Scapular Bone Region  Severe depletion  Dorsal Hand  Moderate depletion  Patellar Region  Severe depletion  Anterior Thigh Region  Severe depletion  Posterior Calf Region  Severe depletion  Edema (RD Assessment)  None  Hair  Reviewed  Eyes  Reviewed  Mouth  Reviewed  Skin  Reviewed  Nails  Reviewed       Diet Order:  Diet regular Room service appropriate? Yes; Fluid consistency: Thin  EDUCATION NEEDS:   Education needs have been addressed  Skin:  Skin Assessment: Reviewed RN Assessment  Last BM:  08/03/17  Height:   Ht Readings from Last 1 Encounters:  08/02/17 5\' 8"  (1.727 m)    Weight:   Wt Readings from Last 1 Encounters:  08/02/17 105 lb 1.6 oz (47.7 kg)    Ideal Body Weight:  70 kg  BMI:  Body mass index is 15.98 kg/m.  Estimated Nutritional Needs:   Kcal:  1700-1900  Protein:  95-110 grams  Fluid:  1.7-1.9 L    Lyndell Allaire A. Mayford KnifeWilliams, RD, LDN, CDE Pager: [phone] After hours Pager: [phone]

## 2017-08-03 NOTE — Progress Notes (Signed)
OT Cancellation Note  Patient Details Name: John Murphy MRN: [id number] DOB: [birth year]   Cancelled Treatment:    Reason Eval/Treat Not Completed: Other (comment): Pt politely declines therapy evaluations reporting he is functioning at independent baseline in hospital setting. He will have 24 hour assistance at home. OT will sign off. Please re-order if needs change.   Doristine Sectionharity A Nikki Glanzer, MS OTR/L  Pager: [phone]   Doristine SectionCharity A Seara Hinesley 08/03/2017, 3:51 PM

## 2017-08-04 DIAGNOSIS — K7031 Alcoholic cirrhosis of liver with ascites: Principal | ICD-10-CM

## 2017-08-04 DIAGNOSIS — E43 Unspecified severe protein-calorie malnutrition: Secondary | ICD-10-CM

## 2017-08-04 LAB — COMPREHENSIVE METABOLIC PANEL
ALT: 13 U/L — AB (ref 17–63)
AST: 44 U/L — ABNORMAL HIGH (ref 15–41)
Albumin: 1.9 g/dL — ABNORMAL LOW (ref 3.5–5.0)
Alkaline Phosphatase: 78 U/L (ref 38–126)
Anion gap: 7 (ref 5–15)
BUN: 3 mg/dL — ABNORMAL LOW (ref 6–20)
CALCIUM: 7.7 mg/dL — AB (ref 8.9–10.3)
CO2: 19 mmol/L — ABNORMAL LOW (ref 22–32)
CREATININE: 0.85 mg/dL (ref 0.61–1.24)
Chloride: 107 mmol/L (ref 101–111)
Glucose, Bld: 114 mg/dL — ABNORMAL HIGH (ref 65–99)
Potassium: 3.7 mmol/L (ref 3.5–5.1)
Sodium: 133 mmol/L — ABNORMAL LOW (ref 135–145)
Total Bilirubin: 0.9 mg/dL (ref 0.3–1.2)
Total Protein: 5.4 g/dL — ABNORMAL LOW (ref 6.5–8.1)

## 2017-08-04 MED ORDER — SPIRONOLACTONE 50 MG PO TABS: 50.0000 mg | ORAL_TABLET | Freq: Every day | ORAL | 0 refills | Status: DC

## 2017-08-04 MED ORDER — CEFPODOXIME PROXETIL 200 MG PO TABS: 200.0000 mg | ORAL_TABLET | Freq: Two times a day (BID) | ORAL | 0 refills | Status: AC

## 2017-08-04 NOTE — Progress Notes (Signed)
Pt discharged per MD order, all discharge instructions reviewed and all questions answered.  

## 2017-08-04 NOTE — Discharge Summary (Signed)
Physician Discharge Summary  John Murphy  ZOX:[id number]  DOB: [birth year]  DOA: 08/02/2017 PCP: Patient, No Pcp Per  Admit date: 08/02/2017 Discharge date: 08/04/2017  Admitted From: Home  Disposition: Home   Recommendations for Outpatient Follow-up:  1. Follow up with PCP in 1-2 weeks 2. Follow up with GI on 08/06/17 3. Please obtain CMP/CBC in one week to monitor Hgb and hepatic function   Discharge Condition: Stable  CODE STATUS: Full Code  Diet recommendation: Heart Healthy  Brief/Interim Summary: 56 y.o.malewith medical history significant ofETOH abuse and cirrhosis.Patient presents with two-week history of gradual worsening abdominal distention, tenderness and shortness of breath. Pt diagnosed with Cirrhosis with symptomatic ascites and covered with antibiotics for possible SBP. Paracentesis was performed by IR which yield 3.2 L. Fluid analysis was not collected, therefore unable to r/o SBP, although clinically low suspicious for SBP. However will complete course of antibiotics as fluid was turbid and patient clinically improved with treatment. Patient is discharge on Vantin to complete 7 days of abx. BP was also low during hospital stay for which he receive albumin and IV fluids. Per patient his BP always on the 90's range. Patient clinically improved and currently asymptomatic. Patient deemed safe for discharge to follow up with PCP and GI.   Subjective: Patient seen and examined, patient report pain has resolved, denies chest pain, SOB, palpitations and dizziness. Able to ambulate by himself with no issues. Tolerating diet well. BP continues to be low normal.   Discharge Diagnoses/Hospital Course:  Ascites due to cirrhosis leading to abdominal pain  Patient is s/p paracentesis removed 3.2L  Fluid analysis was ordered but was not collected, therefore unable to r/o SBP, although clinically low suspicious for SBP. However will complete course of antibiotics as fluid was  turbid and patient clinically improved with treatment. Will continue aldactone for now. Alcohol abstinence    Not encephalopathic will hold on Lactulose for now   Hypotension - Improved, was given albumin. BP low normal.  Follow up with PCP.  Severe malnutrition due to chronic illness  Ensure supplements  Corrected Ca+ 9.4  On the day of the discharge the patient's vitals were stable, and no other acute medical condition were reported by patient. the patient was felt safe to be discharge to home   Discharge Instructions  You were cared for by a hospitalist during your hospital stay. If you have any questions about your discharge medications or the care you received while you were in the hospital after you are discharged, you can call the unit and asked to speak with the hospitalist on call if the hospitalist that took care of you is not available. Once you are discharged, your primary care physician will handle any further medical issues. Please note that NO REFILLS for any discharge medications will be authorized once you are discharged, as it is imperative that you return to your primary care physician (or establish a relationship with a primary care physician if you do not have one) for your aftercare needs so that they can reassess your need for medications and monitor your lab values.  Discharge Instructions    Call MD for:  difficulty breathing, headache or visual disturbances   Complete by:  As directed    Call MD for:  extreme fatigue   Complete by:  As directed    Call MD for:  hives   Complete by:  As directed    Call MD for:  persistant dizziness or light-headedness   Complete by:  As directed    Call MD for:  persistant nausea and vomiting   Complete by:  As directed    Call MD for:  redness, tenderness, or signs of infection (pain, swelling, redness, odor or green/yellow discharge around incision site)   Complete by:  As directed    Call MD for:  severe uncontrolled pain    Complete by:  As directed    Call MD for:  temperature >100.4   Complete by:  As directed    Diet - low sodium heart healthy   Complete by:  As directed    Increase activity slowly   Complete by:  As directed      Allergies as of 08/04/2017   No Known Allergies     Medication List    TAKE these medications   cefpodoxime 200 MG tablet Commonly known as:  VANTIN Take 1 tablet (200 mg total) 2 (two) times daily for 5 days by mouth.   folic acid 1 MG tablet Commonly known as:  FOLVITE Take 1 tablet (1 mg total) by mouth daily.   spironolactone 50 MG tablet Commonly known as:  ALDACTONE Take 1 tablet (50 mg total) daily by mouth. What changed:    medication strength  how much to take   thiamine 100 MG tablet Take 1 tablet (100 mg total) by mouth daily.      Follow-up Information    Meredith PelGuenther, Paula M, NP. Go on 08/06/2017.   Specialty:  Gastroenterology Why:  you have an appointment at 2:45 PM on Friday, 08/06/2017 Contact information: [address] [place] [address] [phone]          No Known Allergies  Consultations:  IR    Procedures/Studies: Dg Chest 2 View  Result Date: 08/02/2017 CLINICAL DATA:  Exertional shortness of breath, orthopnea, cramping sensation in the chest. Sensation of fluid in the lungs. History of cirrhosis with portal hypertension, current smoker. EXAM: CHEST  2 VIEW COMPARISON:  Chest x-ray of July 16, 2017 FINDINGS: The lungs are well-expanded. There is new patchy density at the left base partially obscuring the hemidiaphragm. A small amount of pleural fluid blunts the costophrenic angles. There is a stable densely calcified nodule measuring approximately 11 mm in diameter in the inferior a lateral aspect of the right upper lobe. The heart and pulmonary vascularity are normal. There is calcification in the wall of the aortic arch. The bony thorax exhibits no acute abnormality. IMPRESSION: Small left pleural effusion with  subsegmental atelectasis. No pulmonary vascular congestion. 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 Thoracic aortic atherosclerosis. Electronically Signed   By: David  SwazilandJordan M.D.   On: 08/02/2017 07:44   Dg Chest 2 View  Result Date: 07/16/2017 CLINICAL DATA:  Weight loss.  Cough. EXAM: CHEST  2 VIEW COMPARISON:  None. FINDINGS: Normal heart size. Normal aortic and hilar contours. Calcified nodule in the right lung attributed to old granulomatous disease. There is no edema, consolidation, effusion, or pneumothorax. No acute osseous finding. IMPRESSION: 1. No evidence of active disease. 2. Remote granulomatous changes in the right lung. Electronically Signed   By: Marnee SpringJonathon  Watts M.D.   On: 07/16/2017 20:52   Mr Abdomen W Wo Contrast  Result Date: 07/17/2017 CLINICAL DATA:  Cirrhosis. Right-sided abdominal pain common nausea and vomiting, diarrhea, and weight loss. Liver lesion on recent CT. EXAM: MRI ABDOMEN WITHOUT AND WITH CONTRAST TECHNIQUE: Multiplanar multisequence MR imaging of the abdomen was performed both before and after the administration of  intravenous contrast. CONTRAST:  10mL MULTIHANCE GADOBENATE DIMEGLUMINE 529 MG/ML IV SOLN COMPARISON:  CT on 07/17/2017 FINDINGS: Lower chest: No acute findings. Hepatobiliary: Severe hepatic cirrhosis is demonstrated. Heterogeneous signal intensity of the hepatic parenchyma is seen, which represents a combination of confluent hepatic fibrosis in some areas and severe hepatic steatosis in other areas. A tiny sub-cm cyst is seen in segment 4A. No evidence of hypervascular or otherwise suspicious hepatic masses. Gallbladder is unremarkable. No evidence of biliary ductal dilatation. Pancreas:  No mass or inflammatory changes. Spleen:  Within normal limits in size and appearance. Adrenals/Urinary Tract: No masses identified. Small right upper pole renal cyst noted. No evidence of hydronephrosis. Stomach/Bowel: Small hiatal hernia. Diffuse  small bowel wall thickening, most likely due to hypoalbuminemia. Mild diffuse small bowel dilatation, most likely due to ileus. Vascular/Lymphatic: No pathologically enlarged lymph nodes identified. Patent portal veins. Recanalization of paraumbilical veins, esophageal varices, and left upper quadrant portosystemic venous collaterals, consistent with portal venous hypertension. No abdominal aortic aneurysm. Other:  Moderate ascites and diffuse mesenteric edema. Musculoskeletal:  No suspicious bone lesions identified. IMPRESSION: Severe hepatic cirrhosis, with areas of confluent hepatic fibrosis as well as severe steatosis. No radiographic evidence of hepatocellular carcinoma. Moderate ascites. Other findings consistent with portal venous hypertension, hypoalbuminemia, and mild ileus. Small hiatal hernia. Electronically Signed   By: Myles Rosenthal M.D.   On: 07/17/2017 12:32   Ct Abdomen Pelvis W Contrast  Result Date: 07/17/2017 CLINICAL DATA:  Chronic intermittent right-sided abdominal pain and abdominal distention. Nausea, vomiting, diarrhea and weight loss. Initial encounter. EXAM: CT ABDOMEN AND PELVIS WITH CONTRAST TECHNIQUE: Multidetector CT imaging of the abdomen and pelvis was performed using the standard protocol following bolus administration of intravenous contrast. CONTRAST:  ISOVUE-300 IOPAMIDOL (ISOVUE-300) INJECTION 61% COMPARISON:  None. FINDINGS: Lower chest: A small to moderate hiatal hernia is identified. Esophageal varices are seen. The visualized portions of the mediastinum are unremarkable. The visualized lung bases are clear. Hepatobiliary: There is a very unusual poorly enhancing process involving most of the hepatic parenchyma. This predominantly does not distort the hepatic capsule and underlying vasculature, aside from masslike enlargement of the caudate lobe. Given underlying findings of hepatic cirrhosis and this appearance, confluent hepatic fibrosis is considered more likely.  Malignancy cannot be entirely excluded. The gallbladder is grossly unremarkable in appearance. The common bile duct remains grossly normal in caliber. Pancreas: The pancreas is within normal limits. Spleen: The spleen is unremarkable in appearance. Adrenals/Urinary Tract: The adrenal glands are unremarkable in appearance. A small right renal cyst is noted. There is no evidence of hydronephrosis. No renal or ureteral stones are identified. No perinephric stranding is appreciated. Stomach/Bowel: The stomach is unremarkable in appearance. Gastric varices are noted. The small bowel is within normal limits. The appendix is normal in caliber, without evidence of appendicitis. Wall thickening along the entirety of the colon may reflect underlying hypoalbuminemia. Vascular/Lymphatic: Minimal calcification is noted along the distal abdominal aorta and its branches. The abdominal aorta is otherwise grossly unremarkable. The inferior vena cava is grossly unremarkable. No retroperitoneal lymphadenopathy is seen. No pelvic sidewall lymphadenopathy is identified. Reproductive: The bladder is mildly distended and grossly unremarkable. The prostate remains normal in size. Other: Moderate volume ascites is noted within the abdomen and pelvis. Musculoskeletal: No acute osseous abnormalities are identified. The visualized musculature is unremarkable in appearance. IMPRESSION: 1. Very unusual poorly enhancing process involving most of the hepatic parenchyma. This primarily does not distort the hepatic capsule and underlying vasculature, aside from  masslike enlargement of the caudate lobe. Given underlying hepatic cirrhosis and this appearance, confluent hepatic fibrosis is considered more likely. Malignancy cannot be entirely excluded. Dynamic liver protocol MRI is recommended for further evaluation, when and as deemed clinically appropriate. 2. Moderate volume ascites, gastric varices and esophageal varices, compatible with hepatic  cirrhosis. 3. Wall thickening along the entirety of the colon may reflect underlying hypoalbuminemia. 4. Small to moderate hiatal hernia noted. 5. Small right renal cyst seen. Electronically Signed   By: Roanna Raider M.D.   On: 07/17/2017 00:36   US Paracentesis  Result Date: 07/17/2017 CLINICAL DATA:  Ascites, abdominal pain EXAM: ULTRASOUND GUIDED PARACENTESIS TECHNIQUE: Survey ultrasound of the abdomen was performed and an appropriate skin entry site in the right lower abdomen was selected. Skin site was marked, prepped with chlorhexadine, draped in usual sterile fashion, and infiltrated locally with 1% lidocaine. A sheath needle was advanced into the peritoneal space until fluid could be aspirated. The sheath was advanced and the needle removed. 2.6 L of ascites were aspirated. Samples sent for the requested laboratory studies. COMPLICATIONS: COMPLICATIONS none IMPRESSION: Technically successful ultrasound guided paracentesis, removing 2.6 L ascites. Electronically Signed   By: Corlis Leak M.D.   On: 07/17/2017 13:51   Ir Paracentesis  Result Date: 08/03/2017 INDICATION: Patient with history recurrent ascites. Request is made for therapeutic paracentesis of up to 5 liters maximum. EXAM: ULTRASOUND GUIDED DIAGNOSTIC AND THERAPEUTIC PARACENTESIS MEDICATIONS: 10 mL 2% lidocaine COMPLICATIONS: None immediate. PROCEDURE: Informed written consent was obtained from the patient after a discussion of the risks, benefits and alternatives to treatment. A timeout was performed prior to the initiation of the procedure. Initial ultrasound scanning demonstrates a large amount of ascites within the right lateral abdomen. The right lateral abdomen was prepped and draped in the usual sterile fashion. 2% lidocaine was used for local anesthesia. Following this, a 19 gauge, 7-cm, Yueh catheter was introduced. An ultrasound image was saved for documentation purposes. The paracentesis was performed. The catheter was removed  and a dressing was applied. The patient tolerated the procedure well without immediate post procedural complication. FINDINGS: A total of approximately 3.2 liters of yellow, turbid fluid was removed. IMPRESSION: Successful ultrasound-guided therapeutic paracentesis yielding 3.2 liters of peritoneal fluid. Read by:  Loyce Dys PA-C Electronically Signed   By: Richarda Overlie M.D.   On: 08/03/2017 12:48    Discharge Exam: Vitals:   08/04/17 1233 08/04/17 1315  BP: 91/66 92/64  Pulse:    Resp:    Temp:    SpO2:     Vitals:   08/04/17 0828 08/04/17 0922 08/04/17 1233 08/04/17 1315  BP: (!) 79/69 97/61 91/66  92/64  Pulse: 88 84    Resp: 18 (!) 23    Temp: 98.4 F (36.9 C)     TempSrc: Oral     SpO2: 100% 100%    Weight:      Height:        General: Pt is alert, awake, not in acute distress Cardiovascular: RRR, S1/S2 +, no rubs, no gallops Respiratory: CTA bilaterally, no wheezing, no rhonchi Abdominal: Soft, NT, ND, + bowel sounds Extremities: no edema.  The results of significant diagnostics from this hospitalization (including imaging, microbiology, ancillary and laboratory) are listed below for reference.     Microbiology: Recent Results (from the past 240 hour(s))  MRSA PCR Screening     Status: None   Collection Time: 08/02/17  9:27 PM  Result Value Ref Range Status   MRSA  by PCR NEGATIVE NEGATIVE Final    Comment:        The GeneXpert MRSA Assay (FDA approved for NASAL specimens only), is one component of a comprehensive MRSA colonization surveillance program. It is not intended to diagnose MRSA infection nor to guide or monitor treatment for MRSA infections.      Labs: BNP (last 3 results) Recent Labs    08/02/17 0808  BNP 35.2   Basic Metabolic Panel: Recent Labs  Lab 08/02/17 0721 08/02/17 1650 08/03/17 0236 08/04/17 0936  NA 133*  --  134* 133*  K 3.7  --  3.3* 3.7  CL 102  --  105 107  CO2 23  --  24 19*  GLUCOSE 91  --  95 114*  BUN <5*   --  5* 3*  CREATININE 0.89  --  0.82 0.85  CALCIUM 8.2*  --  7.9* 7.7*  MG  --  1.7 1.6*  --   PHOS  --  3.9 3.5  --    Liver Function Tests: Recent Labs  Lab 08/02/17 0808 08/03/17 0236 08/04/17 0936  AST 43* 38 44*  ALT 20 16* 13*  ALKPHOS 100 86 78  BILITOT 0.8 0.8 0.9  PROT 6.3* 5.2* 5.4*  ALBUMIN 2.2* 1.8* 1.9*   Recent Labs  Lab 08/02/17 0808  LIPASE 57*   Recent Labs  Lab 08/02/17 1650  AMMONIA 32   CBC: Recent Labs  Lab 08/02/17 0721 08/03/17 0236  WBC 11.7* 10.7*  HGB 12.2* 10.7*  HCT 35.9* 30.9*  MCV 103.5* 103.3*  PLT 482* 427*   Cardiac Enzymes: No results for input(s): CKTOTAL, CKMB, CKMBINDEX, TROPONINI in the last 168 hours. BNP: Invalid input(s): POCBNP CBG: No results for input(s): GLUCAP in the last 168 hours. D-Dimer No results for input(s): DDIMER in the last 72 hours. Hgb A1c No results for input(s): HGBA1C in the last 72 hours. Lipid Profile No results for input(s): CHOL, HDL, LDLCALC, TRIG, CHOLHDL, LDLDIRECT in the last 72 hours. Thyroid function studies No results for input(s): TSH, T4TOTAL, T3FREE, THYROIDAB in the last 72 hours.  Invalid input(s): FREET3 Anemia work up No results for input(s): VITAMINB12, FOLATE, FERRITIN, TIBC, IRON, RETICCTPCT in the last 72 hours. Urinalysis    Component Value Date/Time   COLORURINE AMBER (A) 07/16/2017 2353   APPEARANCEUR CLOUDY (A) 07/16/2017 2353   LABSPEC 1.023 07/16/2017 2353   PHURINE 5.0 07/16/2017 2353   GLUCOSEU NEGATIVE 07/16/2017 2353   HGBUR NEGATIVE 07/16/2017 2353   BILIRUBINUR SMALL (A) 07/16/2017 2353   KETONESUR NEGATIVE 07/16/2017 2353   PROTEINUR 100 (A) 07/16/2017 2353   NITRITE NEGATIVE 07/16/2017 2353   LEUKOCYTESUR NEGATIVE 07/16/2017 2353   Sepsis Labs Invalid input(s): PROCALCITONIN,  WBC,  LACTICIDVEN Microbiology Recent Results (from the past 240 hour(s))  MRSA PCR Screening     Status: None   Collection Time: 08/02/17  9:27 PM  Result Value Ref  Range Status   MRSA by PCR NEGATIVE NEGATIVE Final    Comment:        The GeneXpert MRSA Assay (FDA approved for NASAL specimens only), is one component of a comprehensive MRSA colonization surveillance program. It is not intended to diagnose MRSA infection nor to guide or monitor treatment for MRSA infections.      Time coordinating discharge: 32 minutes  SIGNED:  Latrelle DodrillEdwin Silva, MD  [facility] 08/04/2017, 2:06 PM  Pager please text page via  [email or web address] Password [email or web address]

## 2017-08-06 ENCOUNTER — Ambulatory Visit: Payer: BLUE CROSS/BLUE SHIELD | Admitting: Nurse Practitioner

## 2017-08-06 ENCOUNTER — Encounter: Payer: Self-pay | Admitting: Nurse Practitioner

## 2017-08-06 ENCOUNTER — Other Ambulatory Visit: Payer: BLUE CROSS/BLUE SHIELD

## 2017-08-06 VITALS — BP 88/52 | HR 52 | Ht 68.0 in | Wt 105.8 lb

## 2017-08-06 DIAGNOSIS — K7460 Unspecified cirrhosis of liver: Secondary | ICD-10-CM

## 2017-08-06 DIAGNOSIS — R188 Other ascites: Secondary | ICD-10-CM | POA: Diagnosis not present

## 2017-08-06 MED ORDER — FUROSEMIDE 20 MG PO TABS: 20.0000 mg | ORAL_TABLET | ORAL | 1 refills | Status: DC

## 2017-08-06 NOTE — Progress Notes (Addendum)
Chief Complaint:  cirrhosis  HPI: Patient is a 56 yo male with a history of alcohol abuse who was recently admitted to the hospital after presenting to the emergency department with malaise weakness and abdominal pain and swelling.  Hepatitis B and C negative.  HIV negative.  Imaging studies remarkable for cirrhosis complicated by portal hypertension (gastric and esophageal varices, ascites).  Patient underwent a paracentesis with removal of almost 3 L of fluid.  No SBP.  He was started on Aldactone.  The hospitalist spoke with Dr. Leone PayorGessner in our practice who arranged an outpatient follow-up appointment .  In the interim patient represented to the emergency department on 11/12 complaints of shortness of breath and abdominal pain. He had a therapeutic LVP with removal of 3.2 liters. No fluid sent for analysis but he was treated empically for SBP because of the abdominal pain.  His chest feels tight when he breaths, it got better after LVP.Marland Kitchen. CXR on 11/12 showed small left pleural effusion.  He feels tired, just want to sleep. Hasn't been watching sodium, didn't know he was supposed to but doesn't add salk to food.  Patient says he has good urine output.  He is checking daily weights and remained stable around 105 pounds    Past Medical History:  Diagnosis Date  . Cirrhosis, alcoholic (HCC)   . ETOH abuse     Patient's surgical history, family medical history, social history, medications and allergies were all reviewed in Epic    Physical Exam: BP (!) 88/52   Pulse (!) 52   Ht 5\' 8"  (1.727 m)   Wt 105 lb 12.8 oz (48 kg)   BMI 16.09 kg/m   GENERAL:  Emaciated white male in NAD PSYCH: :Pleasant, cooperative, normal affect EENT:  conjunctiva pink, mucous membranes moist, neck supple without masses CARDIAC:  RRR, no murmur heard, no peripheral edema PULM: Normal respiratory effort, lungs CTA bilaterally, no wheezing ABDOMEN:  Nondistended, soft, nontender. No obvious masses, no  hepatomegaly,  normal bowel sounds SKIN:  turgor, no lesions seen Musculoskeletal:  Normal muscle tone, normal strength NEURO: Alert and oriented x 3, no focal neurologic deficits    ASSESSMENT and PLAN:  1. Pleasant 56 year old male with newly diagnosed cirrhosis complicated by portal HTN, most likely ETOH related. Child Pugh B.  Presented to ED with decompensation in October. He has undergone 2 paracentesis since with removal of 3 liters each time. Patient will need close follow up over next few months. We had a lengthy discussion today about cirrhosis regarding management, prognosis, etc.. # First, no ETOH. It is paramount that he never consumes ETOH again. I encouraged him to get involved with AA even if he feels it to be unnecessary. So far, no ETOH in almost 3 weeks now.  #  Ascites.  Will be on 2 g sodium diet.  Patient was not aware that he needed to be watching salt intake though he does not routinely add salt to his food.  I explained how to calculate milligrams of sodium based on servings #  Add low-dose of Lasix., start at 20 mg daily.  Continue Aldactone 50 mg daily for now.  BMET early next week.  #  I think most of his abdominal discomfort is from the distention.  He is not particularly tender.  Five days of Vantin given empirically for SBP at time of  hospital discharge a few days ago. Doubt SBP but he is already taking it so will  continue.  # viral hepatitis studies negative. Will check ANA, ASMA, Alpha 1 anti-trypsin, ferritin  # will need eventual EGD for varices screening. # Obtaining AFP. No focal liver lesions on MR  # Will need eventual hep A,B vaccinations if indicated  # if he requires another paracentesis will need to check cytology, calculate SAAG   2. Tobacco abuse. Smokes 2 packs / day.  -I strongly advised patient to stop immediately  3. Severe malnutrition.  -Encouraged him to work on improving nutrition. He is already drinking Ensure BID   Willette ClusterPaula Guenther ,  NP 08/06/2017, 4:02 PM  Pager number [phone]  Agree with Ms. Guenther's assessment and plan. Iva Booparl E. Gessner, MD, Clementeen GrahamFACG

## 2017-08-06 NOTE — Patient Instructions (Addendum)
If you are age 56 or older, your body mass index should be between 23-30. Your Body mass index is 16.09 kg/m. If this is out of the aforementioned range listed, please consider follow up with your Primary Care Provider.  If you are age 56 or younger, your body mass index should be between 19-25. Your Body mass index is 16.09 kg/m. If this is out of the aformentioned range listed, please consider follow up with your Primary Care Provider.   Your physician has requested that you go to the basement for lab work before leaving today.  Return to lab on 08/10/17 Tuesday for lab work: BMET and Ferritin.  We have sent the following medications to your pharmacy for you to pick up at your convenience: Lasix 20 mg daily Continue Aldactone (same dose) Complete Vantin  Sodium diet - 2 gm (2000mg )/ 24 hours STOP smoking NO alcohol  Follow up with Willette ClusterPaula Guenther, NP on August 24, 2017 at 300 pm.  Thank you for choosing me and [facility].   Willette ClusterPaula Guenther, NP

## 2017-08-08 ENCOUNTER — Encounter: Payer: Self-pay | Admitting: Nurse Practitioner

## 2017-08-10 ENCOUNTER — Other Ambulatory Visit (INDEPENDENT_AMBULATORY_CARE_PROVIDER_SITE_OTHER): Payer: BLUE CROSS/BLUE SHIELD

## 2017-08-10 DIAGNOSIS — K7460 Unspecified cirrhosis of liver: Secondary | ICD-10-CM | POA: Diagnosis not present

## 2017-08-10 DIAGNOSIS — R188 Other ascites: Secondary | ICD-10-CM | POA: Diagnosis not present

## 2017-08-10 LAB — BASIC METABOLIC PANEL
BUN: 5 mg/dL — ABNORMAL LOW (ref 6–23)
CO2: 28 mEq/L (ref 19–32)
CREATININE: 0.68 mg/dL (ref 0.40–1.50)
Calcium: 9 mg/dL (ref 8.4–10.5)
Chloride: 104 mEq/L (ref 96–112)
GFR: 127.93 mL/min (ref >60.00)
Glucose, Bld: 105 mg/dL — ABNORMAL HIGH (ref 70–99)
POTASSIUM: 4 meq/L (ref 3.5–5.1)
Sodium: 137 mEq/L (ref 135–145)

## 2017-08-10 LAB — ALPHA-1-ANTITRYPSIN: A1 ANTITRYPSIN SER: 202 mg/dL — AB (ref 83–199)

## 2017-08-10 LAB — AFP TUMOR MARKER: AFP TUMOR MARKER: 5.9 ng/mL (ref <6.1)

## 2017-08-10 LAB — ANA: ANA: NEGATIVE

## 2017-08-10 LAB — ANTI-SMOOTH MUSCLE ANTIBODY, IGG

## 2017-08-10 LAB — FERRITIN: FERRITIN: 439.9 ng/mL — AB (ref 22.0–322.0)

## 2017-08-17 LAB — FUNGUS CULTURE WITH STAIN

## 2017-08-17 LAB — FUNGAL ORGANISM REFLEX

## 2017-08-17 LAB — FUNGUS CULTURE RESULT

## 2017-08-24 ENCOUNTER — Encounter: Payer: Self-pay | Admitting: Nurse Practitioner

## 2017-08-24 ENCOUNTER — Other Ambulatory Visit (INDEPENDENT_AMBULATORY_CARE_PROVIDER_SITE_OTHER): Payer: BLUE CROSS/BLUE SHIELD

## 2017-08-24 ENCOUNTER — Ambulatory Visit: Payer: BLUE CROSS/BLUE SHIELD | Admitting: Nurse Practitioner

## 2017-08-24 VITALS — BP 80/58 | HR 58 | Ht 68.0 in | Wt 102.0 lb

## 2017-08-24 DIAGNOSIS — K7460 Unspecified cirrhosis of liver: Secondary | ICD-10-CM | POA: Diagnosis not present

## 2017-08-24 LAB — CBC WITH DIFFERENTIAL/PLATELET
Basophils Absolute: 0.2 10*3/uL — ABNORMAL HIGH (ref 0.0–0.1)
Basophils Relative: 1.2 % (ref 0.0–3.0)
EOS PCT: 0.9 % (ref 0.0–5.0)
Eosinophils Absolute: 0.1 10*3/uL (ref 0.0–0.7)
HCT: 40.3 % (ref 39.0–52.0)
HEMOGLOBIN: 13.4 g/dL (ref 13.0–17.0)
Lymphocytes Relative: 17.9 % (ref 12.0–46.0)
Lymphs Abs: 2.4 10*3/uL (ref 0.7–4.0)
MCHC: 33.2 g/dL (ref 30.0–36.0)
MCV: 102.9 fl — ABNORMAL HIGH (ref 78.0–100.0)
MONO ABS: 1.6 10*3/uL — AB (ref 0.1–1.0)
Monocytes Relative: 12.4 % — ABNORMAL HIGH (ref 3.0–12.0)
Neutro Abs: 8.9 10*3/uL — ABNORMAL HIGH (ref 1.4–7.7)
Neutrophils Relative %: 67.6 % (ref 43.0–77.0)
Platelets: 497 10*3/uL — ABNORMAL HIGH (ref 150.0–400.0)
RBC: 3.92 Mil/uL — AB (ref 4.22–5.81)
RDW: 13.4 % (ref 11.5–15.5)
WBC: 13.2 10*3/uL — AB (ref 4.0–10.5)

## 2017-08-24 LAB — BASIC METABOLIC PANEL
BUN: 6 mg/dL (ref 6–23)
CO2: 27 mEq/L (ref 19–32)
Calcium: 9.2 mg/dL (ref 8.4–10.5)
Chloride: 101 mEq/L (ref 96–112)
Creatinine, Ser: 0.66 mg/dL (ref 0.40–1.50)
GFR: 132.4 mL/min (ref >60.00)
Glucose, Bld: 102 mg/dL — ABNORMAL HIGH (ref 70–99)
POTASSIUM: 4.3 meq/L (ref 3.5–5.1)
SODIUM: 134 meq/L — AB (ref 135–145)

## 2017-08-24 NOTE — Progress Notes (Addendum)
Chief Complaint:  Follow up  HPI: Patient is a 56 year old male who I saw as a new patient to the practice mid November. He was sent for evaluation / management of cirrhosis, please refer to that dictation.   John Murphy hasn't had any ETOH since prior to hospital admission. He is here with girlfriend. His weight was 105 pounds at last appt in mid November, down to 102 today (with boots which he didn't have on last time). Appetite is good. He was on Aldactone 50 mg daily, I added 20 mg of lasix daily and explained a two grams sodium diet. He is weighing himself at home. He has been coming for labs to monitor renal function which has remained normal.  He assures me of adherence to the low sodium diet. No swelling in legs. Feels stomach a little swollen. Think he has a fever sometimes but doesn't check it. He has mild generalized abdominal discomfort.    Past Medical History:  Diagnosis Date  . Cirrhosis, alcoholic (HCC)   . ETOH abuse     Patient's surgical history, family medical history, social history, medications and allergies were all reviewed in Epic    Physical Exam: BP (!) 80/58   Pulse (!) 58   Ht 5\' 8"  (1.727 m)   Wt 102 lb (46.3 kg)   BMI 15.51 kg/m   GENERAL:  Thin chronically ill appearing white male in NAD PSYCH: :Pleasant, cooperative, normal affect EENT:  conjunctiva pink, mucous membranes moist, neck supple without masses CARDIAC:  RRR, no murmur heard, no peripheral edema PULM: Normal respiratory effort, lungs CTA bilaterally, no wheezing ABDOMEN:  Soft, mildly distended, no obvious masses, normal bowel sounds SKIN:  turgor, no lesions seen Musculoskeletal:  Normal muscle tone, normal strength NEURO: Alert and oriented x 3, no focal neurologic deficits    Ref. Range 08/06/2017 16:12  A-1 Antitrypsin, Ser Latest Ref Range: 83 - 199 mg/dL 161202 (H)  Anit Nuclear Antibody(ANA) Latest Ref Range: NEGATIVE  NEGATIVE     Ref. Range 08/06/2017 16:12  AFP Tumor  Marker Latest Ref Range: <6.1 ng/mL 5.9    Ref. Range 07/17/2017 07:04  Hep A Ab, IgM Latest Ref Range: Negative  Negative  Hepatitis B Surface Ag Latest Ref Range: Negative  Negative  Hep B Core Ab, IgM Latest Ref Range: Negative  Negative  HCV Ab Latest Ref Range: 0.0 - 0.9 s/co ratio 0.3     Ref. Range 08/10/2017 10:16  Ferritin Latest Ref Range: 22.0 - 322.0 ng/mL 439.9 (H)     Ref. Range 08/06/2017 16:12  AFP Tumor Marker Latest Ref Range: <6.1 ng/mL 5.9   ASSESSMENT and PLAN:  Pleasant 56 year old male with recently diagnosed cirrhosis complicated by portal HTN. Cirrhosis presumably ETOH related. MELD 8. He may have mild ascites, otherwise appears compensated. This is his second visit.   -Viral hepatitis studies negative. Genetic / autoimmune studies were negative except ferritin 438 which may be etoh related. Should be rechecked in near future.  -Patient had paracentesis done 07/17/17. He appears to have a small amount of ascites on exam today. He is not tender on exam but does complain of some generalized abdominal pain. Await CBC. He may need a diagnostic tap to rule out SBP -I think he is getting a little dry and BP low, hold diuretics until labs reviewed.  -check for hep A, B immunity, may need vaccinated -HCC screening: Normal AFP. CT scan  very unusual poorly enhancing process  involving most of the hepatic parenchyma. Given underlying hepatic cirrhosis and this appearance, confluent hepatic fibrosis is considered more likely. Malignancy cannot be entirely excluded. May need MRI at some point to better characterize findings MRI WAS DONE AND NO SIGNS OF HCC -EGD for varices screening. Gastric and esophageal varices present on CT scan in October. The risks and benefits of EGD were discussed and the patient agrees to proceed.  -continue to avoid ETOH.   Willette ClusterPaula Guenther , NP 08/24/2017, 3:17 PM   Agree with Ms. Guenther's assessment and plan.  Also - would not recheck  ferritin Had an MR  John Booparl E. Gessner, MD, [facility]

## 2017-08-24 NOTE — Patient Instructions (Signed)
If you are age 56 or older, your body mass index should be between 23-30. Your Body mass index is 15.51 kg/m. If this is out of the aforementioned range listed, please consider follow up with your Primary Care Provider.  If you are age 56 or younger, your body mass index should be between 19-25. Your Body mass index is 15.51 kg/m. If this is out of the aformentioned range listed, please consider follow up with your Primary Care Provider.   You have been scheduled for an endoscopy. Please follow written instructions given to you at your visit today. If you use inhalers (even only as needed), please bring them with you on the day of your procedure. Your physician has requested that you go to www.startemmi.com and enter the access code given to you at your visit today. This web site gives a general overview about your procedure. However, you should still follow specific instructions given to you by our office regarding your preparation for the procedure.  Your physician has requested that you go to the basement for lab work before leaving today.   HOLD Lasix and Aldactone until you hear back from me.  Thank you for choosing me and  Gastroenterology.   Willette ClusterPaula Guenther, NP

## 2017-08-25 LAB — HEPATITIS B SURFACE ANTIBODY,QUALITATIVE: Hep B S Ab: NONREACTIVE

## 2017-08-25 LAB — HEPATITIS A ANTIBODY, TOTAL: Hepatitis A AB,Total: NONREACTIVE

## 2017-08-27 ENCOUNTER — Encounter: Payer: Self-pay | Admitting: Nurse Practitioner

## 2017-09-02 ENCOUNTER — Other Ambulatory Visit: Payer: Self-pay

## 2017-09-02 ENCOUNTER — Telehealth: Payer: Self-pay

## 2017-09-02 DIAGNOSIS — K7469 Other cirrhosis of liver: Secondary | ICD-10-CM

## 2017-09-02 NOTE — Telephone Encounter (Signed)
Patient says his neck is swollen on one side. Sore to touch. No redness of the skin. No fever. He has had it for 2 days. Not worsening, but it is persistent. Any advise? He does not have a PCP.

## 2017-09-06 ENCOUNTER — Other Ambulatory Visit: Payer: Self-pay

## 2017-09-06 ENCOUNTER — Other Ambulatory Visit: Payer: Self-pay | Admitting: Nurse Practitioner

## 2017-09-06 ENCOUNTER — Ambulatory Visit (HOSPITAL_COMMUNITY): Payer: BLUE CROSS/BLUE SHIELD

## 2017-09-06 ENCOUNTER — Ambulatory Visit (HOSPITAL_COMMUNITY)
Admission: RE | Admit: 2017-09-06 | Discharge: 2017-09-06 | Disposition: A | Discharge status: Home / Self Care | Payer: BLUE CROSS/BLUE SHIELD | Source: Ambulatory Visit | Attending: Nurse Practitioner | Admitting: Nurse Practitioner

## 2017-09-06 DIAGNOSIS — K7469 Other cirrhosis of liver: Secondary | ICD-10-CM | POA: Diagnosis present

## 2017-09-06 DIAGNOSIS — R188 Other ascites: Secondary | ICD-10-CM | POA: Insufficient documentation

## 2017-09-06 MED ORDER — ENSURE HIGH PROTEIN PO LIQD: 1.0000 | Freq: Three times a day (TID) | ORAL | 0 refills | Status: AC

## 2017-09-06 NOTE — Telephone Encounter (Signed)
I have sent the supplements as a prescription in hopes insurance will cover some of the costs. Any recommendations for pain control? Any adjustments for the diuretics?

## 2017-09-06 NOTE — Telephone Encounter (Signed)
Spoke with the patient and his care giver. They were successful in getting to the hospital for IR paracentesis. He was sent home because there was "not enough fluid" to have the procedure done. He is continuing to lose weight. Today they report weight of 90 lbs.  He understands he should seek an internist for the question of his neck pain. It is unchanged.  Confirmed he is taking Lasix 20 mg daily and Aldactone 50 mg daily. He is eating "but not much." He hurts "all the time." He has taken ASA "a couple of times" because he "hurts so bad." His abdomen is uncomfortable and he "hurts all over."  The patient does not have transportation. He has to ask friends for rides to any treatments.

## 2017-09-06 NOTE — Telephone Encounter (Signed)
He can seek help ant an urgent care vs finding a PCP - could give him [facility] # and he can see if someone can take him as a patient soon.  Some of his weight loss has been due to diuretics and fluid coming off - for sure.  He needs to take Boost or Ensure or something similar for nutrition - should try to take in 2-3 cans a day if he can  John Murphy is right he does need to get a PCP  Unfortunately we cannot fix his transportation problem

## 2017-09-06 NOTE — Telephone Encounter (Signed)
John Murphy, he needs to find a primary care doctor. I don't know what is going on with the side of his neck. But I would like to know how he feels. Abdominal pain? Also I had held his diuretics. How is his weight?  Any swelling in his legs?

## 2017-09-06 NOTE — Telephone Encounter (Signed)
Thanks. I certainly don't want to increase diuretics, especially since no ascites to drain. Please get him back in to see me and lets recheck his white count with a CBC and also check on his renal function sometime this week. Obviously my concern is that I don't want to over diurese him. When you talk to him please get a feel for volume status. Urinating adequate amounts? Any leg swelling (you may have already told me that). Any dizziness or weakness? If so then hold diuretics. Thanks

## 2017-09-07 NOTE — Telephone Encounter (Signed)
He doesn't have LE edema. He is urinating. Has multiple diarrhea stools. No dizziness or weakness. His care provider will ask friends for transportation. Order for cmet and cbc are in Epic.

## 2017-09-09 ENCOUNTER — Other Ambulatory Visit: Payer: Self-pay

## 2017-09-09 DIAGNOSIS — K7031 Alcoholic cirrhosis of liver with ascites: Secondary | ICD-10-CM

## 2017-09-09 MED ORDER — SPIRONOLACTONE 50 MG PO TABS: 50.0000 mg | ORAL_TABLET | Freq: Every day | ORAL | 0 refills | Status: DC

## 2017-09-09 MED ORDER — FUROSEMIDE 20 MG PO TABS: 20.0000 mg | ORAL_TABLET | ORAL | 1 refills | Status: DC

## 2017-09-09 MED ORDER — SPIRONOLACTONE 50 MG PO TABS
50.0000 mg | ORAL_TABLET | Freq: Every day | ORAL | 0 refills | Status: DC
Start: 2017-09-09 — End: 2017-09-09

## 2017-09-09 NOTE — Telephone Encounter (Signed)
He has a ride to have labs done 09/15/17. He is urinating frequently but cannot tell me the volume. He has frequent diarrhea.  Medications refilled (aldactone and Lasix). Otherwise he would be out after tomorrow.

## 2017-09-09 NOTE — Telephone Encounter (Signed)
Debbie calling to check if he needs labwork done?

## 2017-09-10 NOTE — Telephone Encounter (Signed)
Beth, bmet, cbc and inr when he can get a ride. Also, we need to see him back in next couple of weeks. Yes please refill the diuretics. thankis

## 2017-09-10 NOTE — Telephone Encounter (Signed)
John Murphy notified Follow up arranged for 09/27/17

## 2017-09-15 ENCOUNTER — Other Ambulatory Visit (INDEPENDENT_AMBULATORY_CARE_PROVIDER_SITE_OTHER): Payer: BLUE CROSS/BLUE SHIELD

## 2017-09-15 DIAGNOSIS — K7031 Alcoholic cirrhosis of liver with ascites: Secondary | ICD-10-CM | POA: Diagnosis not present

## 2017-09-15 LAB — COMPREHENSIVE METABOLIC PANEL
ALK PHOS: 77 U/L (ref 39–117)
ALT: 8 U/L (ref 0–53)
AST: 19 U/L (ref 0–37)
Albumin: 3.1 g/dL — ABNORMAL LOW (ref 3.5–5.2)
BILIRUBIN TOTAL: 0.4 mg/dL (ref 0.2–1.2)
BUN: 13 mg/dL (ref 6–23)
CALCIUM: 9.1 mg/dL (ref 8.4–10.5)
CO2: 27 mEq/L (ref 19–32)
CREATININE: 0.7 mg/dL (ref 0.40–1.50)
Chloride: 102 mEq/L (ref 96–112)
GFR: 123.68 mL/min (ref >60.00)
Glucose, Bld: 105 mg/dL — ABNORMAL HIGH (ref 70–99)
Potassium: 4.1 mEq/L (ref 3.5–5.1)
Sodium: 133 mEq/L — ABNORMAL LOW (ref 135–145)
TOTAL PROTEIN: 6.8 g/dL (ref 6.0–8.3)

## 2017-09-15 LAB — CBC WITH DIFFERENTIAL/PLATELET
BASOS ABS: 0.1 10*3/uL (ref 0.0–0.1)
Basophils Relative: 1.1 % (ref 0.0–3.0)
EOS ABS: 0.3 10*3/uL (ref 0.0–0.7)
Eosinophils Relative: 2.9 % (ref 0.0–5.0)
HEMATOCRIT: 33.3 % — AB (ref 39.0–52.0)
Hemoglobin: 11.3 g/dL — ABNORMAL LOW (ref 13.0–17.0)
LYMPHS ABS: 2.3 10*3/uL (ref 0.7–4.0)
LYMPHS PCT: 26.3 % (ref 12.0–46.0)
MCHC: 33.9 g/dL (ref 30.0–36.0)
MCV: 101.1 fl — AB (ref 78.0–100.0)
MONOS PCT: 13.1 % — AB (ref 3.0–12.0)
Monocytes Absolute: 1.2 10*3/uL — ABNORMAL HIGH (ref 0.1–1.0)
NEUTROS ABS: 5 10*3/uL (ref 1.4–7.7)
NEUTROS PCT: 56.6 % (ref 43.0–77.0)
PLATELETS: 459 10*3/uL — AB (ref 150.0–400.0)
RBC: 3.3 Mil/uL — AB (ref 4.22–5.81)
RDW: 13.8 % (ref 11.5–15.5)
WBC: 8.8 10*3/uL (ref 4.0–10.5)

## 2017-09-27 ENCOUNTER — Ambulatory Visit: Payer: BLUE CROSS/BLUE SHIELD | Admitting: Nurse Practitioner

## 2017-09-28 ENCOUNTER — Encounter: Payer: Self-pay | Admitting: Internal Medicine

## 2017-10-04 ENCOUNTER — Other Ambulatory Visit: Payer: Self-pay

## 2017-10-04 ENCOUNTER — Ambulatory Visit (AMBULATORY_SURGERY_CENTER): Payer: BLUE CROSS/BLUE SHIELD | Admitting: Internal Medicine

## 2017-10-04 ENCOUNTER — Encounter: Payer: Self-pay | Admitting: Internal Medicine

## 2017-10-04 VITALS — BP 104/58 | HR 61 | Temp 97.8°F | Resp 14 | Ht 68.0 in | Wt 102.0 lb

## 2017-10-04 DIAGNOSIS — K766 Portal hypertension: Secondary | ICD-10-CM

## 2017-10-04 DIAGNOSIS — K3189 Other diseases of stomach and duodenum: Secondary | ICD-10-CM

## 2017-10-04 DIAGNOSIS — K7030 Alcoholic cirrhosis of liver without ascites: Secondary | ICD-10-CM

## 2017-10-04 MED ORDER — SODIUM CHLORIDE 0.9 % IV SOLN: 500.0000 mL | INTRAVENOUS | Status: AC

## 2017-10-04 NOTE — Progress Notes (Signed)
Pt. Reports no change in his medical or surgical history since his pre-visit  08/24/2017.

## 2017-10-04 NOTE — Progress Notes (Signed)
To PACU, VSS. Report to [email or web address].tb 

## 2017-10-04 NOTE — Patient Instructions (Addendum)
Good news - no varices or swollen veins in the esophagus.  There is some swelling in the stomach called portal gastropathy - very mild.  Please call soon and set up a follow-up appointment to see me in March  I appreciate the opportunity to care for you.  Iva Booparl E. Cyriah Childrey, MD, [facility]  Discharge instructions given. No specimen collected. He is to call now and schedule an office visit for March. Resume previous medications. YOU HAD AN ENDOSCOPIC PROCEDURE TODAY AT THE [facility]:   Refer to the procedure report that was given to you for any specific questions about what was found during the examination.  If the procedure report does not answer your questions, please call your gastroenterologist to clarify.  If you requested that your care partner not be given the details of your procedure findings, then the procedure report has been included in a sealed envelope for you to review at your convenience later.  YOU SHOULD EXPECT: Some feelings of bloating in the abdomen. Passage of more gas than usual.  Walking can help get rid of the air that was put into your GI tract during the procedure and reduce the bloating. If you had a lower endoscopy (such as a colonoscopy or flexible sigmoidoscopy) you may notice spotting of blood in your stool or on the toilet paper. If you underwent a bowel prep for your procedure, you may not have a normal bowel movement for a few days.  Please Note:  You might notice some irritation and congestion in your nose or some drainage.  This is from the oxygen used during your procedure.  There is no need for concern and it should clear up in a day or so.  SYMPTOMS TO REPORT IMMEDIATELY:    Following upper endoscopy (EGD)  Vomiting of blood or coffee ground material  New chest pain or pain under the shoulder blades  Painful or persistently difficult swallowing  New shortness of breath  Fever of 100F or higher  Black, tarry-looking stools  For  urgent or emergent issues, a gastroenterologist can be reached at any hour by calling [phone].   DIET:  We do recommend a small meal at first, but then you may proceed to your regular diet.  Drink plenty of fluids but you should avoid alcoholic beverages for 24 hours.  ACTIVITY:  You should plan to take it easy for the rest of today and you should NOT DRIVE or use heavy machinery until tomorrow (because of the sedation medicines used during the test).    FOLLOW UP: Our staff will call the number listed on your records the next business day following your procedure to check on you and address any questions or concerns that you may have regarding the information given to you following your procedure. If we do not reach you, we will leave a message.  However, if you are feeling well and you are not experiencing any problems, there is no need to return our call.  We will assume that you have returned to your regular daily activities without incident.  If any biopsies were taken you will be contacted by phone or by letter within the next 1-3 weeks.  Please call us at [phone] if you have not heard about the biopsies in 3 weeks.    SIGNATURES/CONFIDENTIALITY: You and/or your care partner have signed paperwork which will be entered into your electronic medical record.  These signatures attest to the fact that that the information above  on your After Visit Summary has been reviewed and is understood.  Full responsibility of the confidentiality of this discharge information lies with you and/or your care-partner.

## 2017-10-04 NOTE — Op Note (Signed)
[facility] Patient Name: John Murphy Procedure Date: 10/04/2017 10:28 AM MRN: [id number] Endoscopist: Iva Boop , MD Age: 57 Referring MD:  Date of Birth: November 22, 1960 Gender: Male Account #: [id number] Procedure:                Upper GI endoscopy Indications:              Cirrhosis rule out esophageal varices Medicines:                Propofol per Anesthesia, Monitored Anesthesia Care Procedure:                Pre-Anesthesia Assessment:                           - Prior to the procedure, a History and Physical                            was performed, and patient medications and                            allergies were reviewed. The patient's tolerance of                            previous anesthesia was also reviewed. The risks                            and benefits of the procedure and the sedation                            options and risks were discussed with the patient.                            All questions were answered, and informed consent                            was obtained. Prior Anticoagulants: The patient has                            taken no previous anticoagulant or antiplatelet                            agents. ASA Grade Assessment: III - A patient with                            severe systemic disease. After reviewing the risks                            and benefits, the patient was deemed in                            satisfactory condition to undergo the procedure.                           After obtaining informed consent, the endoscope was  passed under direct vision. Throughout the                            procedure, the patient's blood pressure, pulse, and                            oxygen saturations were monitored continuously. The                            Model GIF-[id number]) scope was introduced                            through the mouth, and advanced to the second part              of duodenum. The upper GI endoscopy was                            accomplished without difficulty. The patient                            tolerated the procedure well. Scope In: Scope Out: Findings:                 The examined esophagus was normal.                           Mild portal hypertensive gastropathy was found in                            the cardia, in the gastric fundus and in the                            gastric body.                           The exam was otherwise without abnormality.                           The cardia and gastric fundus were normal on                            retroflexion. Complications:            No immediate complications. Estimated Blood Loss:     Estimated blood loss: none. Impression:               - Normal esophagus.                           - Portal hypertensive gastropathy.                           - The examination was otherwise normal.                           - No specimens collected. Recommendation:           - Patient has a contact number available for  emergencies. The signs and symptoms of potential                            delayed complications were discussed with the                            patient. Return to normal activities tomorrow.                            Written discharge instructions were provided to the                            patient.                           - Resume previous diet.                           - Continue present medications.                           - HE IS TO CALL NOW AND SCHEDULE AN OFFICE VISIT                            FOR MARCH Iva Boop, MD 10/04/2017 10:43:52 AM This report has been signed electronically.

## 2017-10-05 ENCOUNTER — Telehealth: Payer: Self-pay | Admitting: *Deleted

## 2017-10-05 NOTE — Telephone Encounter (Signed)
  Follow up Call-  Call back number 10/04/2017 10/04/2017  Post procedure Call Back phone  # [phone]-this is number to call. [phone]  Pt. not sure of no. - no. from chart info  Permission to leave phone message Yes Yes  Some recent data might be hidden     Patient questions:  Do you have a fever, pain , or abdominal swelling? No. Pain Score  0 *  Have you tolerated food without any problems? Yes.    Have you been able to return to your normal activities? Yes.    Do you have any questions about your discharge instructions: Diet   No. Medications  No. Follow up visit  No.  Do you have questions or concerns about your Care? No.  Actions: * If pain score is 4 or above: No action needed, pain <4.  Pt. Was still asleep,wife stated "he did just fine".

## 2017-10-25 ENCOUNTER — Telehealth: Payer: Self-pay | Admitting: Nurse Practitioner

## 2017-10-26 ENCOUNTER — Other Ambulatory Visit: Payer: Self-pay

## 2017-10-26 MED ORDER — SPIRONOLACTONE 50 MG PO TABS: 50.0000 mg | ORAL_TABLET | Freq: Every day | ORAL | 2 refills | Status: DC

## 2017-10-26 NOTE — Telephone Encounter (Signed)
Patient calling back regarding this.  °

## 2017-10-26 NOTE — Telephone Encounter (Signed)
Prescription sent to pharmacy. Okayed per Claypool HillPaula.

## 2018-03-09 ENCOUNTER — Other Ambulatory Visit: Payer: Self-pay | Admitting: Nurse Practitioner

## 2018-03-09 NOTE — Telephone Encounter (Signed)
Spoke with the care giver. Patient has not had any follow up. Reports he is "doing great" and no swelling. Out of medication for "a couple of days." States he is following the low sodium diet and he is abstaining from alcohol. He has applied for disability.  Aldactone last filled in February #30/2 Appointment made for follow up 03/21/18. (can only come on Mondays)

## 2018-03-10 ENCOUNTER — Other Ambulatory Visit: Payer: Self-pay

## 2018-03-10 MED ORDER — SPIRONOLACTONE 50 MG PO TABS: 50.0000 mg | ORAL_TABLET | Freq: Every day | ORAL | 5 refills | Status: DC

## 2018-03-10 NOTE — Telephone Encounter (Signed)
Refill spironolactone x 5

## 2018-03-10 NOTE — Telephone Encounter (Signed)
Care giver John Murphy advised of refill.

## 2018-03-21 ENCOUNTER — Ambulatory Visit: Payer: BLUE CROSS/BLUE SHIELD | Admitting: Nurse Practitioner

## 2018-04-04 ENCOUNTER — Other Ambulatory Visit (INDEPENDENT_AMBULATORY_CARE_PROVIDER_SITE_OTHER): Payer: BLUE CROSS/BLUE SHIELD

## 2018-04-04 ENCOUNTER — Encounter: Payer: Self-pay | Admitting: Nurse Practitioner

## 2018-04-04 ENCOUNTER — Ambulatory Visit: Payer: BLUE CROSS/BLUE SHIELD | Admitting: Nurse Practitioner

## 2018-04-04 ENCOUNTER — Encounter (INDEPENDENT_AMBULATORY_CARE_PROVIDER_SITE_OTHER): Payer: Self-pay

## 2018-04-04 VITALS — BP 90/60 | HR 65 | Ht 68.0 in | Wt 106.0 lb

## 2018-04-04 DIAGNOSIS — N62 Hypertrophy of breast: Secondary | ICD-10-CM

## 2018-04-04 DIAGNOSIS — R5383 Other fatigue: Secondary | ICD-10-CM

## 2018-04-04 DIAGNOSIS — K7460 Unspecified cirrhosis of liver: Secondary | ICD-10-CM | POA: Diagnosis not present

## 2018-04-04 LAB — CBC WITH DIFFERENTIAL/PLATELET
BASOS ABS: 0.1 10*3/uL (ref 0.0–0.1)
BASOS PCT: 0.8 % (ref 0.0–3.0)
EOS ABS: 0.2 10*3/uL (ref 0.0–0.7)
Eosinophils Relative: 1.7 % (ref 0.0–5.0)
HEMATOCRIT: 43.5 % (ref 39.0–52.0)
HEMOGLOBIN: 14.7 g/dL (ref 13.0–17.0)
LYMPHS PCT: 34.8 % (ref 12.0–46.0)
Lymphs Abs: 3.4 10*3/uL (ref 0.7–4.0)
MCHC: 33.8 g/dL (ref 30.0–36.0)
MCV: 94.5 fl (ref 78.0–100.0)
Monocytes Absolute: 1.1 10*3/uL — ABNORMAL HIGH (ref 0.1–1.0)
Monocytes Relative: 11.8 % (ref 3.0–12.0)
Neutro Abs: 4.9 10*3/uL (ref 1.4–7.7)
Neutrophils Relative %: 50.9 % (ref 43.0–77.0)
Platelets: 326 10*3/uL (ref 150.0–400.0)
RBC: 4.6 Mil/uL (ref 4.22–5.81)
RDW: 14.2 % (ref 11.5–15.5)
WBC: 9.6 10*3/uL (ref 4.0–10.5)

## 2018-04-04 LAB — HEPATIC FUNCTION PANEL
ALK PHOS: 80 U/L (ref 39–117)
ALT: 12 U/L (ref 0–53)
AST: 17 U/L (ref 0–37)
Albumin: 4.3 g/dL (ref 3.5–5.2)
BILIRUBIN DIRECT: 0.1 mg/dL (ref 0.0–0.3)
BILIRUBIN TOTAL: 0.6 mg/dL (ref 0.2–1.2)
TOTAL PROTEIN: 7.4 g/dL (ref 6.0–8.3)

## 2018-04-04 MED ORDER — MIRTAZAPINE 15 MG PO TABS: 7.5000 mg | ORAL_TABLET | Freq: Every day | ORAL | 1 refills | Status: DC

## 2018-04-04 NOTE — Patient Instructions (Signed)
If you are age 57 or older, your body mass index should be between 23-30. Your Body mass index is 16.12 kg/m. If this is out of the aforementioned range listed, please consider follow up with your Primary Care Provider.  If you are age 35 or younger, your body mass index should be between 19-25. Your Body mass index is 16.12 kg/m. If this is out of the aformentioned range listed, please consider follow up with your Primary Care Provider.   Your provider has requested that you go to the basement level for lab work before leaving today. Press "B" on the elevator. The lab is located at the first door on the left as you exit the elevator. CBC Hepatic Function  We have sent the following medications to your pharmacy for you to pick up at your convenience: Remeron - helps with appetite  STOP DIURETICS - Lasix and Alactone.  Monitor weights.  Start 2 gram Sodium diet.  INCREASE Ensure to twice daily.  Follow up with me on August 14. 2019 at 2 pm.  Thank you for choosing me and [facility].   Willette Cluster, NP Two Gram Sodium Diet 2000 mg  What is Sodium? Sodium is a mineral found naturally in many foods. The most significant source of sodium in the diet is table salt, which is about 40% sodium.  Processed, convenience, and preserved foods also contain a large amount of sodium.  The body needs only 500 mg of sodium daily to function,  A normal diet provides more than enough sodium even if you do not use salt.  Why Limit Sodium? A build up of sodium in the body can cause thirst, increased blood pressure, shortness of breath, and water retention.  Decreasing sodium in the diet can reduce edema and risk of heart attack or stroke associated with high blood pressure.  Keep in mind that there are many other factors involved in these health problems.  Heredity, obesity, lack of exercise, cigarette smoking, stress and what you eat all play a role.  General Guidelines:  Do not add  salt at the table or in cooking.  One teaspoon of salt contains over 2 grams of sodium.  Read food labels  Avoid processed and convenience foods  Ask your dietitian before eating any foods not dicussed in the menu planning guidelines  Consult your physician if you wish to use a salt substitute or a sodium containing medication such as antacids.  Limit milk and milk products to 16 oz (2 cups) per day.  Shopping Hints:  READ LABELS!! "Dietetic" does not necessarily mean low sodium.  Salt and other sodium ingredients are often added to foods during processing.   Menu Planning Guidelines Food Group Choose More Often Avoid  Beverages (see also the milk group All fruit juices, low-sodium, salt-free vegetables juices, low-sodium carbonated beverages Regular vegetable or tomato juices, commercially softened water used for drinking or cooking  Breads and Cereals Enriched white, wheat, rye and pumpernickel bread, hard rolls and dinner rolls; muffins, cornbread and waffles; most dry cereals, cooked cereal without added salt; unsalted crackers and breadsticks; low sodium or homemade bread crumbs Bread, rolls and crackers with salted tops; quick breads; instant hot cereals; pancakes; commercial bread stuffing; self-rising flower and biscuit mixes; regular bread crumbs or cracker crumbs  Desserts and Sweets Desserts and sweets mad with mild should be within allowance Instant pudding mixes and cake mixes  Fats Butter or margarine; vegetable oils; unsalted salad dressings, regular salad dressings limited to  1 Tbs; light, sour and heavy cream Regular salad dressings containing bacon fat, bacon bits, and salt pork; snack dips made with instant soup mixes or processed cheese; salted nuts  Fruits Most fresh, frozen and canned fruits Fruits processed with salt or sodium-containing ingredient (some dried fruits are processed with sodium sulfites        Vegetables Fresh, frozen vegetables and low- sodium  canned vegetables Regular canned vegetables, sauerkraut, pickled vegetables, and others prepared in brine; frozen vegetables in sauces; vegetables seasoned with ham, bacon or salt pork  Condiments, Sauces, Miscellaneous  Salt substitute with physician's approval; pepper, herbs, spices; vinegar, lemon or lime juice; hot pepper sauce; garlic powder, onion powder, low sodium soy sauce (1 Tbs.); low sodium condiments (ketchup, chili sauce, mustard) in limited amounts (1 tsp.) fresh ground horseradish; unsalted tortilla chips, pretzels, potato chips, popcorn, salsa (1/4 cup) Any seasoning made with salt including garlic salt, celery salt, onion salt, and seasoned salt; sea salt, rock salt, kosher salt; meat tenderizers; monosodium glutamate; mustard, regular soy sauce, barbecue, sauce, chili sauce, teriyaki sauce, steak sauce, Worcestershire sauce, and most flavored vinegars; canned gravy and mixes; regular condiments; salted snack foods, olives, picles, relish, horseradish sauce, catsup   Food preparation: Try these seasonings Meats:    Pork Sage, onion Serve with applesauce  Chicken Poultry seasoning, thyme, parsley Serve with cranberry sauce  Lamb Curry powder, rosemary, garlic, thyme Serve with mint sauce or jelly  Veal Marjoram, basil Serve with current jelly, cranberry sauce  Beef Pepper, bay leaf Serve with dry mustard, unsalted chive butter  Fish [place] leaf, dill Serve with unsalted lemon butter, unsalted parsley butter  Vegetables:    Asparagus Lemon juice   Broccoli Lemon juice   Carrots Mustard dressing parsley, mint, nutmeg, glazed with unsalted butter and sugar   Green beans Marjoram, lemon juice, nutmeg,dill seed   Tomatoes Basil, marjoram, onion   Spice /blend for [employer]" 4 tsp ground thyme 1 tsp ground sage 3 tsp ground rosemary 4 tsp ground marjoram   Test your knowledge 1. A product that says "Salt Free" may still contain sodium. True or False 2. Garlic Powder and Hot Pepper  Sauce an be used as alternative seasonings.True or False 3. Processed foods have more sodium than fresh foods.  True or False 4. Canned Vegetables have less sodium than froze True or False  WAYS TO DECREASE YOUR SODIUM INTAKE 1. Avoid the use of added salt in cooking and at the table.  Table salt (and other prepared seasonings which contain salt) is probably one of the greatest sources of sodium in the diet.  Unsalted foods can gain flavor from the sweet, sour, and butter taste sensations of herbs and spices.  Instead of using salt for seasoning, try the following seasonings with the foods listed.  Remember: how you use them to enhance natural food flavors is limited only by your creativity... Allspice-Meat, fish, eggs, fruit, peas, red and yellow vegetables Almond Extract-Fruit baked goods Anise Seed-Sweet breads, fruit, carrots, beets, cottage cheese, cookies (tastes like licorice) Basil-Meat, fish, eggs, vegetables, rice, vegetables salads, soups, sauces Bay Leaf-Meat, fish, stews, poultry Burnet-Salad, vegetables (cucumber-like flavor) Caraway Seed-Bread, cookies, cottage cheese, meat, vegetables, cheese, rice Cardamon-Baked goods, fruit, soups Celery Powder or seed-Salads, salad dressings, sauces, meatloaf, soup, bread.Do not use  celery salt Chervil-Meats, salads, fish, eggs, vegetables, cottage cheese (parsley-like flavor) Chili Power-Meatloaf, chicken cheese, corn, eggplant, egg dishes Chives-Salads cottage cheese, egg dishes, soups, vegetables, sauces Cilantro-Salsa, casseroles Cinnamon-Baked goods, fruit, pork,  lamb, chicken, carrots Cloves-Fruit, baked goods, fish, pot roast, green beans, beets, carrots Coriander-Pastry, cookies, meat, salads, cheese (lemon-orange flavor) Cumin-Meatloaf, fish,cheese, eggs, cabbage,fruit pie (caraway flavor) [employer], fruit, eggs, fish, poultry, cottage cheese, vegetables Dill Seed-Meat, cottage cheese, poultry, vegetables, fish, salads,  bread Fennel Seed-Bread, cookies, apples, pork, eggs, fish, beets, cabbage, cheese, Licorice-like flavor Garlic-(buds or powder) Salads, meat, poultry, fish, bread, butter, vegetables, potatoes.Do not  use garlic salt Ginger-Fruit, vegetables, baked goods, meat, fish, poultry Horseradish Root-Meet, vegetables, butter Lemon Juice or Extract-Vegetables, fruit, tea, baked goods, fish salads Mace-Baked goods fruit, vegetables, fish, poultry (taste like nutmeg) Maple Extract-Syrups Marjoram-Meat, chicken, fish, vegetables, breads, green salads (taste like Sage) Mint-Tea, lamb, sherbet, vegetables, desserts, carrots, cabbage Mustard, Dry or Seed-Cheese, eggs, meats, vegetables, poultry Nutmeg-Baked goods, fruit, chicken, eggs, vegetables, desserts Onion Powder-Meat, fish, poultry, vegetables, cheese, eggs, bread, rice salads (Do not use   Onion salt) Orange Extract-Desserts, baked goods Oregano-Pasta, eggs, cheese, onions, pork, lamb, fish, chicken, vegetables, green salads Paprika-Meat, fish, poultry, eggs, cheese, vegetables Parsley Flakes-Butter, vegetables, meat fish, poultry, eggs, bread, salads (certain forms may   Contain sodium Pepper-Meat fish, poultry, vegetables, eggs Peppermint Extract-Desserts, baked goods Poppy Seed-Eggs, bread, cheese, fruit dressings, baked goods, noodles, vegetables, cottage  [employer], poultry, meat, fish, cauliflower, turnips,eggs bread Saffron-Rice, bread, veal, chicken, fish, eggs Sage-Meat, fish, poultry, onions, eggplant, tomateos, pork, stews Savory-Eggs, salads, poultry, meat, rice, vegetables, soups, pork Tarragon-Meat, poultry, fish, eggs, butter, vegetables (licorice-like flavor)  Thyme-Meat, poultry, fish, eggs, vegetables, (clover-like flavor), sauces, soups Tumeric-Salads, butter, eggs, fish, rice, vegetables (saffron-like flavor) Vanilla Extract-Baked goods, candy Vinegar-Salads, vegetables, meat  marinades Walnut Extract-baked goods, candy  2. Choose your Foods Wisely   The following is a list of foods to avoid which are high in sodium:  Meats-Avoid all smoked, canned, salt cured, dried and kosher meat and fish as well as Anchovies   [employer] meats:Bologna, Liverwurst, Pastrami Canned meat or fish  Marinated herring Caviar    Pepperoni Corned Beef   Pizza Dried chipped beef  Salami Frozen breaded fish or meat Salt pork Frankfurters or hot dogs  Sardines Gefilte fish   Sausage Ham (boiled ham, Proscuitto Smoked butt    spiced ham)   Spam      TV Dinners Vegetables Canned vegetables (Regular) Relish Canned mushrooms  Sauerkraut Olives    Tomato juice Pickles  Bakery and Dessert Products Canned puddings  Cream pies Cheesecake   Decorated cakes Cookies  Beverages/Juices Tomato juice, regular  Gatorade   V-8 vegetable juice, regular  Breads and Cereals Biscuit mixes   Salted potato chips, corn chips, pretzels Bread stuffing mixes  Salted crackers and rolls Pancake and waffle mixes Self-rising flour  Seasonings Accent    Meat sauces Barbecue sauce  Meat tenderizer Catsup    Monosodium glutamate (MSG) Celery salt   Onion salt Chili sauce   Prepared mustard Garlic salt   Salt, seasoned salt, sea salt Gravy mixes   Soy sauce Horseradish   Steak sauce Ketchup   Tartar sauce Lite salt    Teriyaki sauce Marinade mixes   Worcestershire sauce  Others Baking powder   Cocoa and cocoa mixes Baking soda   Commercial casserole mixes Candy-caramels, chocolate  Dehydrated soups    Bars, fudge,nougats  Instant rice and pasta mixes Canned broth or soup  Maraschino cherries Cheese, aged and processed cheese and cheese spreads  Learning Assessment Quiz  Indicated T (for True) or F (for False) for  each of the following statements:  1. _____ Fresh fruits and vegetables and unprocessed grains are generally low in sodium 2. _____ Water may contain a  considerable amount of sodium, depending on the source 3. _____ You can always tell if a food is high in sodium by tasting it 4. _____ Certain laxatives my be high in sodium and should be avoided unless prescribed   by a physician or pharmacist 5. _____ Salt substitutes may be used freely by anyone on a sodium restricted diet 6. _____ Sodium is present in table salt, food additives and as a natural component of   most foods 7. _____ Table salt is approximately 90% sodium 8. _____ Limiting sodium intake may help prevent excess fluid accumulation in the body 9. _____ On a sodium-restricted diet, seasonings such as bouillon soy sauce, and    cooking wine should be used in place of table salt 10. _____ On an ingredient list, a product which lists monosodium glutamate as the first   ingredient is an appropriate food to include on a low sodium diet  Circle the best answer(s) to the following statements (Hint: there may be more than one correct answer)  11. On a low-sodium diet, some acceptable snack items are:    A. Olives  F. Bean dip   K. Grapefruit juice    B. Salted Pretzels G. Commercial Popcorn   L. Canned peaches    C. Carrot Sticks  H. Bouillon   M. Unsalted nuts   D. JamaicaFrench fries  I. Peanut butter crackers N. Salami   E. Sweet pickles J. Tomato Juice   O. Pizza  12.  Seasonings that may be used freely on a reduced - sodium diet include   A. Lemon wedges F.Monosodium glutamate K. Celery seed    B.Soysauce   G. Pepper   L. Mustard powder   C. Sea salt  H. Cooking wine  M. Onion flakes   D. Vinegar  E. Prepared horseradish N. Salsa   E. Sage   J. Worcestershire sauce  O. Chutney

## 2018-04-04 NOTE — Progress Notes (Addendum)
      IMPRESSION and PLAN:    #1. 57 yo male with hx of ETOH abuse / cirrhosis complicated by portal HTN. He appears well compensated at this point. 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   -ascites at time of diagnosis, s/p 3 liter LVP. On low dose diuretics since then but not taking daily due to dizziness and polyuria. No ascites or edema on exam today. SBP 90's. For now will discontinue diuretics. He will check daily weights, adhere to low sodium diet and call for weight gain or swelling.    -Varices screening: EGD Jan 2019 - no varices, mild portal gastropathy -HCC screening: no liver lesions on CT scan nor MRI in Oct.  AFP 5.9 in November 2018  -He needs Hep A,B vaccines but since not feeling well right now we will hold off until next visit.  2. ? Gynecomastia. Breast tenderness and enlargement, especially right breast. This is most likely secondary to aldactone.  -Holding diuretics right now anyway but when restarted should replace aldactone with Inspra.   #3.  Fatigue / diminished appetite / generally just doesn't feel well but no focal complaints. Weight is low but stable overall.  -Trial of Remeron. Should be used cautiously with hepatic impairment so will reduced dose of 7.5 mg daily -follow up with me in 3-4 weeks.  -continue Ensure, he will increase to two cans a day.   #4. Colon cancer screening.  -Should get at some point when feeling better.      Agree with Ms. Ayn Domangue's assessment and plan. Iva Booparl E. Gessner, MD, [facility]   HPI:    Chief Complaint:  Cirrhosis follow up    Patient is a hx of ETOH abuse / cirrhosis diagnosed around Nov 2018. No ETOH since October. Both of his breast have been tender and developed knot in right breast. Overall he feels poor / tired. He doesn't have an appetite. No N/V. No abdominal pain . Gets dizzy sometimes. He is trying to follow low sodium diet. Skips diuretics some days due to dizziness and  polyuria. Also feels like breast tenderness worse when taking diuretics. No rectal bleeding or black stools.    Review of systems:     No chest pain, no SOB, no fevers, no urinary sx   Past Medical History:  Diagnosis Date  . Cirrhosis, alcoholic (HCC)   . ETOH abuse     Patient's surgical history, family medical history, social history, medications and allergies were all reviewed in Epic   Creatinine clearance cannot be calculated (Patient's most recent lab result is older than the maximum 21 days allowed.)   Physical Exam:     BP 90/60   Pulse 65   Ht 5\' 8"  (1.727 m)   Wt 106 lb (48.1 kg)   BMI 16.12 kg/m   GENERAL:  Pleasant male in NAD PSYCH: : Cooperative, normal affect EENT:  conjunctiva pink, mucous membranes moist, neck supple without masses CARDIAC:  RRR,  no peripheral edema PULM: Normal respiratory effort, lungs CTA bilaterally, no wheezing ABDOMEN:  Nondistended, soft, nontender. No obvious masses,  normal bowel sounds SKIN:  turgor, no lesions seen Musculoskeletal:  Normal muscle tone, normal strength NEURO: Alert and oriented x 3, no focal neurologic deficits   Willette ClusterPaula Vontrell Pullman , NP 04/04/2018, 3:14 PM

## 2018-04-05 ENCOUNTER — Encounter: Payer: Self-pay | Admitting: Nurse Practitioner

## 2018-05-04 ENCOUNTER — Encounter: Payer: Self-pay | Admitting: Nurse Practitioner

## 2018-05-04 ENCOUNTER — Ambulatory Visit: Payer: BLUE CROSS/BLUE SHIELD | Admitting: Nurse Practitioner

## 2018-05-04 VITALS — BP 100/60 | HR 68 | Ht 68.0 in | Wt 109.6 lb

## 2018-05-04 DIAGNOSIS — K766 Portal hypertension: Secondary | ICD-10-CM | POA: Diagnosis not present

## 2018-05-04 DIAGNOSIS — K7030 Alcoholic cirrhosis of liver without ascites: Secondary | ICD-10-CM | POA: Diagnosis not present

## 2018-05-04 DIAGNOSIS — Z23 Encounter for immunization: Secondary | ICD-10-CM | POA: Diagnosis not present

## 2018-05-04 DIAGNOSIS — K7460 Unspecified cirrhosis of liver: Secondary | ICD-10-CM

## 2018-05-04 NOTE — Progress Notes (Addendum)
Primary GI:   John Headarl Gessner, MD   Chief Complaint:    Cirrhosis and follow up on fatigue weight loss  IMPRESSION and PLAN:    #1. 57 yo male with hx ETOH abuse / cirrhosis complicated by portal HTN. Child Pugh A. No evidence for decompensation. Evaluated last month for fatigue / diminished appetite. Started him on Remeron and here for follow up -weight up 3 pounds since starting Remeron, appetite has improved. He was started on only 7.5 mg of Remeron because of liver disease but patient increased dose himself to the full 15 mg.  -I would like to eventually get him off Remeron since it is extensively metabolized by the liver.  -start HAV / HBV vaccination series today -not following 2 gram sodium diet closely but no fluid accumulation even off diuretics.  -if diuretics required in future would use amiloride instead of aldactone due to gynecomastia / breast tenderness  -Varices screening: He is up to date. EGD Jan 2019 - no varices, mild portal gastropathy -HCC screening: Up to date. No liver lesions on CT scan nor MRI in Oct 2018.  AFP 5.9 in November 2018   #2.  Colon cancer screening. Wants to hold off for now due to excessive medical bills.   #3. Depression. John Murphy is pleasant but feels down a lot. Says he can't work anymore , has no money.  He has a male companion but that seems to be the extent of his support system.   -we talked about anti-depressants. Remeron is helping appetite but not doing much for his depressed mood. Perhaps we can find somethingg which can effectively treat depression and improve his appetite. I will  research and see what is safe for him to take from a liver standpoint. I will call him next week.  Addendum 05/13/18 - patient is a Child-Pugh A. I discussed case with Dr. Leone PayorGessner. Will start patient on prozac 20 mg daily for depression. He will also continue Remeron for now. Will need to monitor liver and response to treatment. If doesn't already have a follow up  will get him one for about 4-6 weeks.     #4. Tobacco use. He doesn't want to quit yet, gave her ETOH not too long ago.    I am less concerned about the Remeron given that his liver function is in ChildsA-B range  John Booparl Murphy. Gessner, MD, [facility]   HPI:     Patient is a 57 yo male with hx of Etoh abuse / cirrhosis with portal hypertension. I saw him last month, complained of painful breast swelling and also fatigue and poor appetite. Breast swelling has significantly improved after stopping aldactone. He has been able to stay off diuretics. For decreased appetite we started 1/2 dose of Remeron. Patient increased dose himself to the full 15 mg. Weight is comiing up slowly. He is up 3 pounds from 106 mid July to 109 today Wakes up some morning feeling nauseated but doesn't vomit. Otherwise he feels okay physically. No swelling in abdomen or legs. No abominal pain. He feels depressed a lot, especially about inability of work, just got disability.   Review of systems:     No chest pain, no SOB, no fevers, no urinary sx   Past Medical History:  Diagnosis Date  . Cirrhosis, alcoholic (HCC)   . ETOH abuse     Patient's surgical history, family medical history, social history, medications and allergies were all reviewed in Epic   Creatinine clearance cannot  be calculated (Patient's most recent lab result is older than the maximum 21 days allowed.)  Current Outpatient Medications  Medication Sig Dispense Refill  . mirtazapine (REMERON) 15 MG tablet Take 0.5 tablets (7.5 mg total) by mouth at bedtime. 30 tablet 1  . Nutritional Supplements (ENSURE HIGH PROTEIN) LIQD Take 1 Can by mouth 3 (three) times daily. 90 Can 0   Current Facility-Administered Medications  Medication Dose Route Frequency Provider Last Rate Last Dose  . 0.9 %  sodium chloride infusion  500 mL Intravenous Continuous John BoopGessner, John E, MD        Physical Exam:     BP 100/60   Pulse 68   Ht 5\' 8"  (1.727 m)   Wt 109 lb 9.6 oz  (49.7 kg)   BMI 16.66 kg/m   GENERAL:  Pleasant male in NAD PSYCH: : Cooperative, normal affect EENT:  conjunctiva pink, mucous membranes moist, neck supple without masses CARDIAC:  RRR, no peripheral edema PULM: Normal respiratory effort, lungs CTA bilaterally, no wheezing ABDOMEN:  Nondistended, soft, nontender. No obvious masses, no hepatomegaly,  normal bowel sounds SKIN:  turgor, no lesions seen Musculoskeletal:  Normal muscle tone, normal strength NEURO: Alert and oriented x 3, no focal neurologic deficits   John Murphy , NP 05/04/2018, 2:31 PM

## 2018-05-04 NOTE — Patient Instructions (Signed)
If you are age 57 or older, your body mass index should be between 23-30. Your Body mass index is 16.66 kg/m. If this is out of the aforementioned range listed, please consider follow up with your Primary Care Provider.  If you are age 57 or younger, your body mass index should be between 19-25. Your Body mass index is 16.66 kg/m. If this is out of the aformentioned range listed, please consider follow up with your Primary Care Provider.   You have been given a Twinrix injection today.  Your next injection will be Sept. 13,2019 at 9 am.  I will call you next week about anti-depressant.    Follow up appointment with Dr. Leone PayorGessner in three months. Please call for a follow up appointment in a month as the schedule is not available at this time.  Thank you for choosing me and [facility].   Willette ClusterPaula Guenther, NP

## 2018-05-07 ENCOUNTER — Encounter: Payer: Self-pay | Admitting: Nurse Practitioner

## 2018-05-17 ENCOUNTER — Other Ambulatory Visit: Payer: Self-pay

## 2018-05-17 ENCOUNTER — Telehealth: Payer: Self-pay

## 2018-05-17 MED ORDER — FLUOXETINE HCL 20 MG PO CAPS: 20.0000 mg | ORAL_CAPSULE | Freq: Every day | ORAL | 3 refills | Status: AC

## 2018-05-17 MED ORDER — MIRTAZAPINE 15 MG PO TABS: 15.0000 mg | ORAL_TABLET | Freq: Every day | ORAL | 0 refills | Status: AC

## 2018-05-17 NOTE — Telephone Encounter (Signed)
Instructed on the plan for medications. [facility]

## 2018-05-17 NOTE — Telephone Encounter (Signed)
-----   Message from Meredith PelPaula M Guenther, NP sent at 05/17/2018 11:40 AM EDT ----- John SandyBeth, for now he will stay on the Remeron. Eventually would like to get him off from it do to liver disease but for now will continue. He increase dose himself from 7.5 to 15 mg daily. In 3 weeks ask him to decrease back to 7.5 mg , hopefully Prozac dose will be therapeutic by then though it may not help with appetite. Thanks ----- Message ----- From: Evalee JeffersonMcKew, Daevon Holdren A, LPN Sent: 1/61/09608/26/2019  11:30 AM EDT To: Meredith PelPaula M Guenther, NP  What is the plan with the Remeron? He states he is to maintain Remeron 15 mg. If this is correct, I need to send a new Rx for this dosage. Please advise. ----- Message ----- From: Meredith PelGuenther, Paula M, NP Sent: 05/13/2018   5:24 PM EDT To: Evalee JeffersonElizabeth A Ameet Sandy, LPN  John Murphy, on Monday morning will you please call John MaduroRobert and tell him that I will start him on Prozac 20 mg once daily to help his depression.  He should continue the Remeron.  Please call the prescription in for him, #30 with 2 refills.  If he does not have a follow-up appointment with me or Dr. Leone PayorGessner please get him one sometime in 6 to 8 weeks. Thanks

## 2018-06-03 ENCOUNTER — Ambulatory Visit (INDEPENDENT_AMBULATORY_CARE_PROVIDER_SITE_OTHER): Payer: BLUE CROSS/BLUE SHIELD | Admitting: Internal Medicine

## 2018-06-03 DIAGNOSIS — Z23 Encounter for immunization: Secondary | ICD-10-CM

## 2018-06-03 DIAGNOSIS — K7460 Unspecified cirrhosis of liver: Secondary | ICD-10-CM | POA: Diagnosis not present

## 2019-07-10 IMAGING — DX DG CHEST 2V
2 series · 2 of 2 positions shown · non-contrast
Comparison: Chest x-ray of July 16, 2017

CLINICAL DATA: Exertional shortness of breath, orthopnea, cramping
sensation in the chest. Sensation of fluid in the lungs. History of
cirrhosis with portal hypertension, current smoker.

EXAM:
CHEST  2 VIEW

[chest pa]
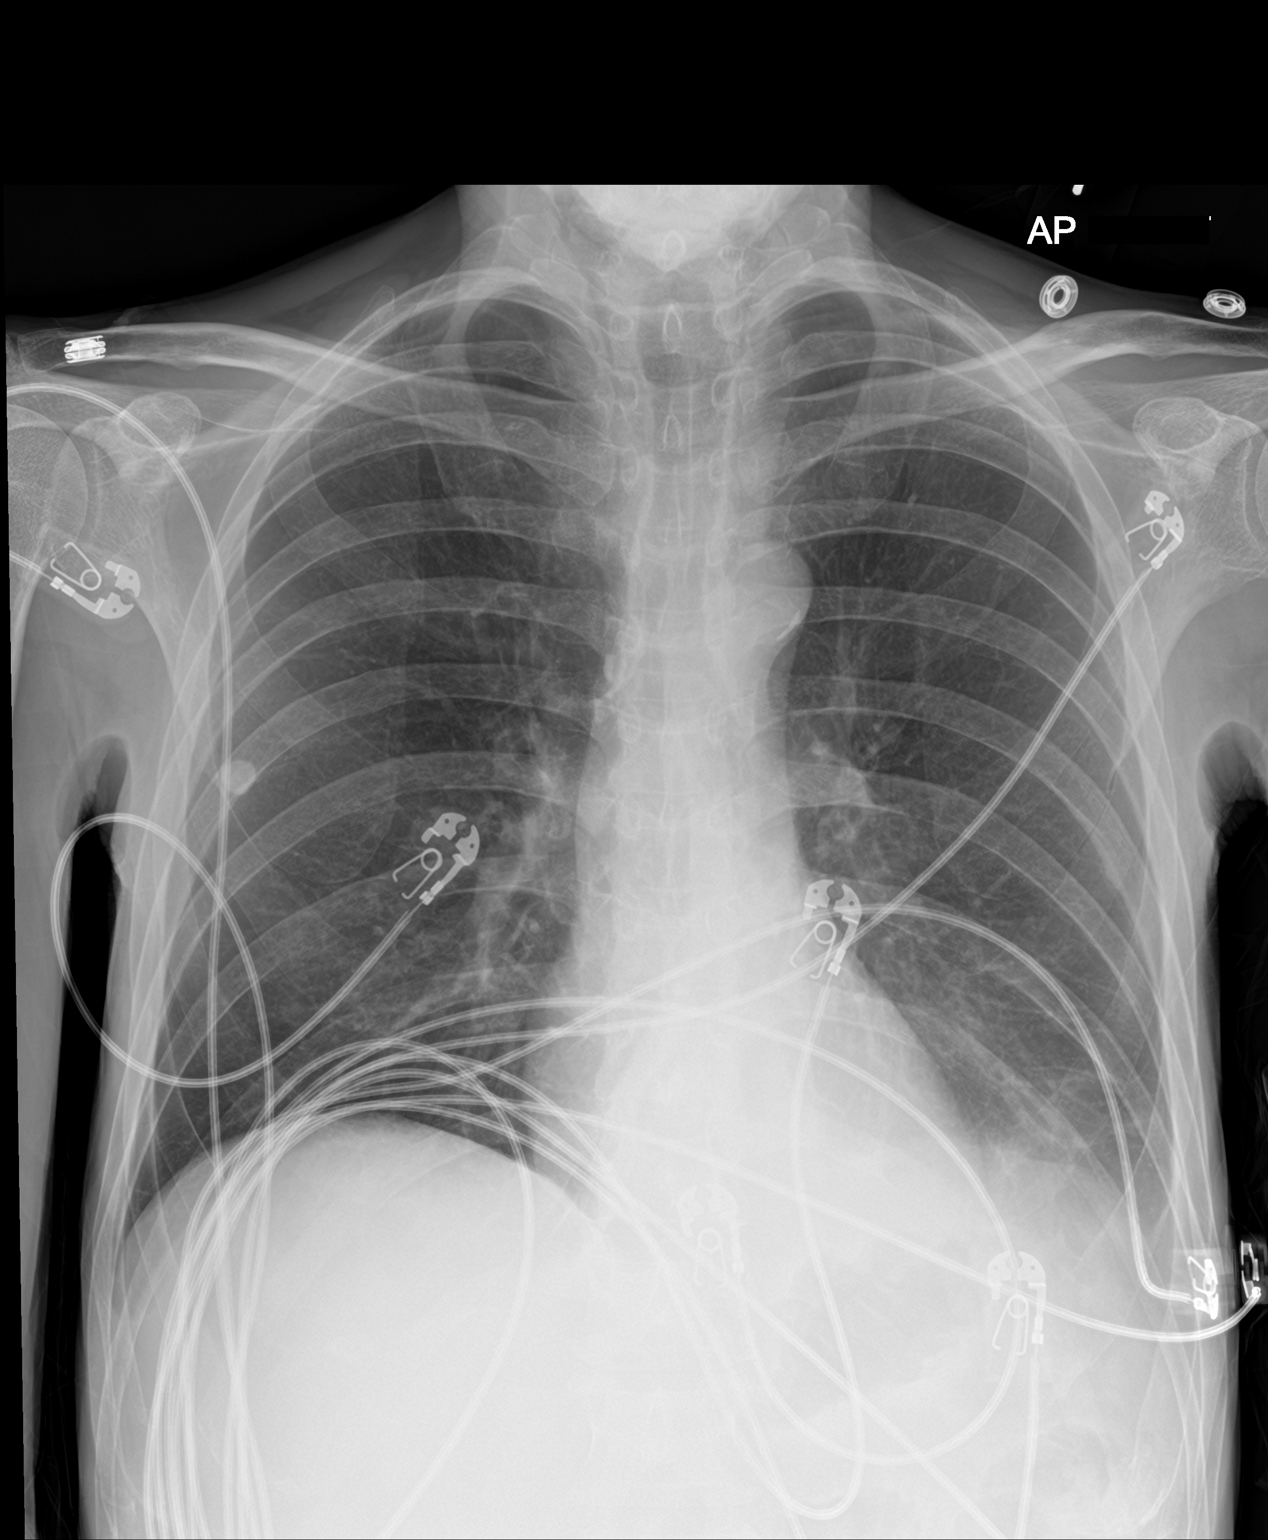

[chest lat]
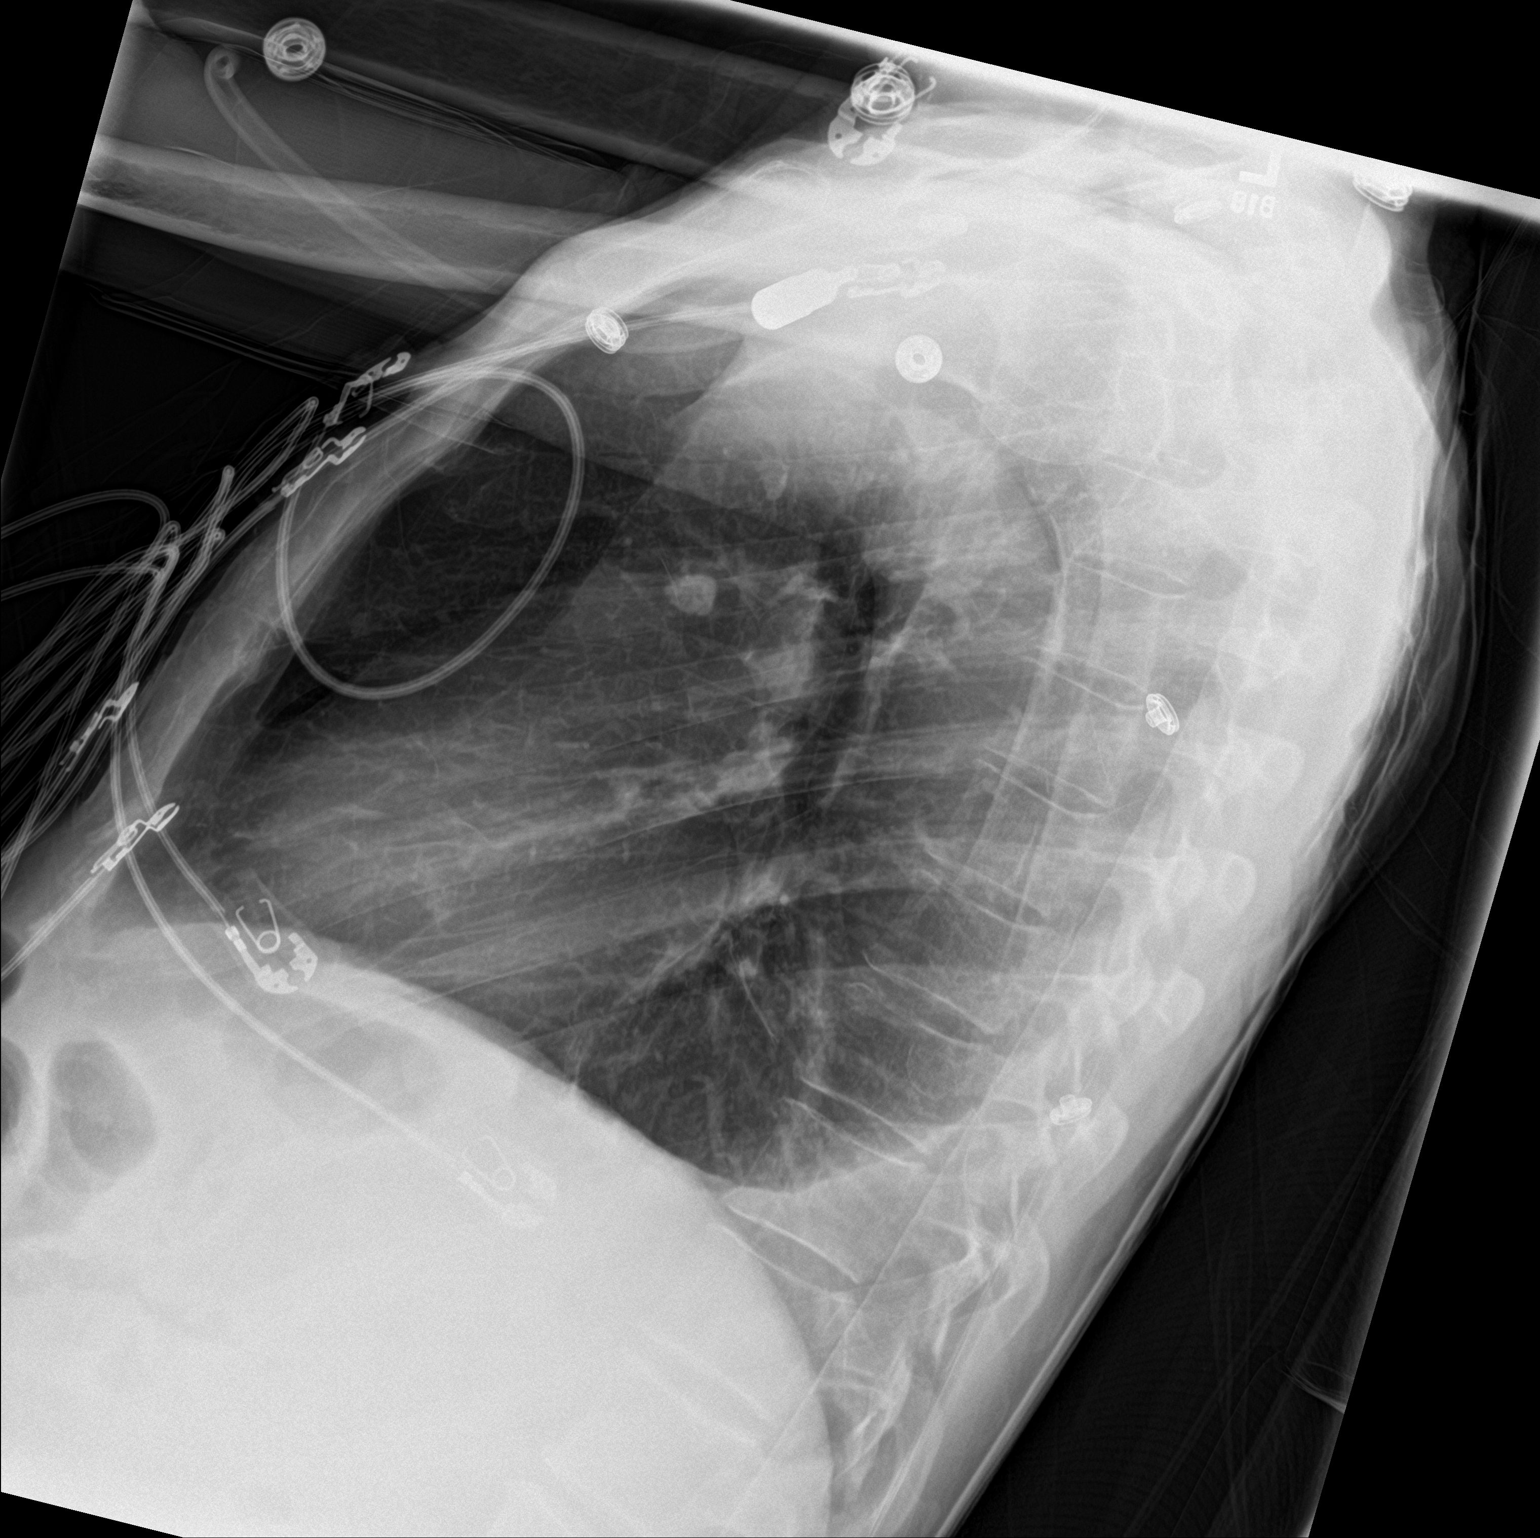

[2 of 2 positions shown; findings below may reference images not displayed]

FINDINGS: The lungs are well-expanded. There is new patchy density at the left
base partially obscuring the hemidiaphragm. A small amount of
pleural fluid blunts the costophrenic angles. There is a stable
densely calcified nodule measuring approximately 11 mm in diameter
in the inferior a lateral aspect of the right upper lobe. The heart
and pulmonary vascularity are normal. There is calcification in the
wall of the aortic arch. The bony thorax exhibits no acute
abnormality.
IMPRESSION: Small left pleural effusion with subsegmental atelectasis. No
pulmonary vascular congestion.

Previous granulomatous infection in the right lung, stable.

Thoracic aortic atherosclerosis.

## 2019-07-11 IMAGING — US IR PARACENTESIS
1 series · 3 of 3 positions shown · non-contrast
Comparison: none

INDICATION: Patient with history recurrent ascites. Request is made for
therapeutic paracentesis of up to 5 liters maximum.

[Series 1: ir (id) (id)/(id)/(id) ir · 3 of 3 slices shown]
[im 1/3]
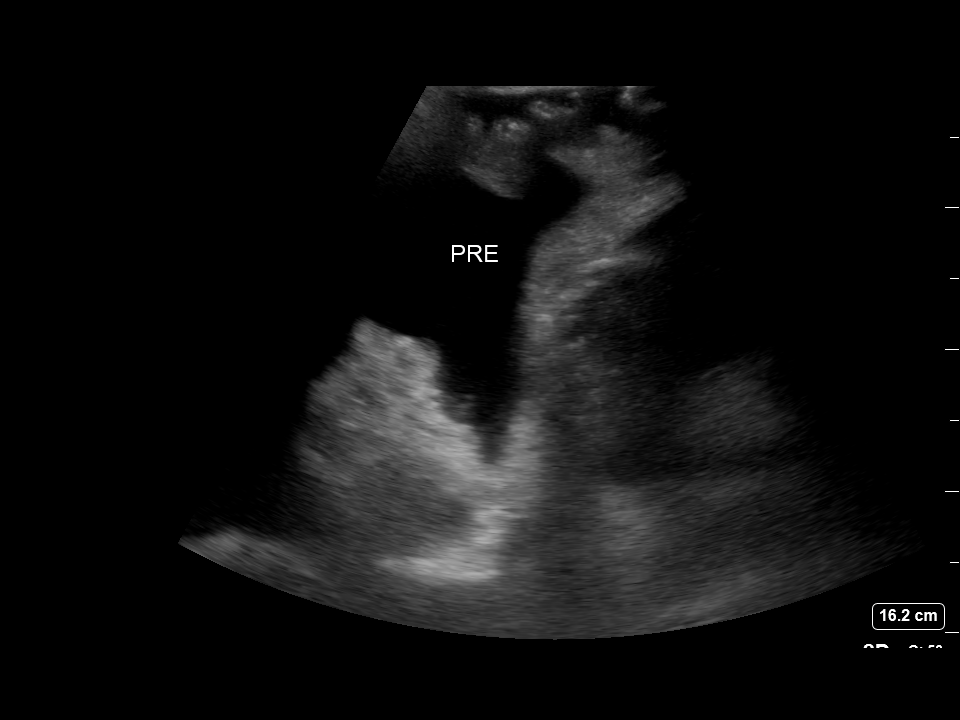
[im 2/3]
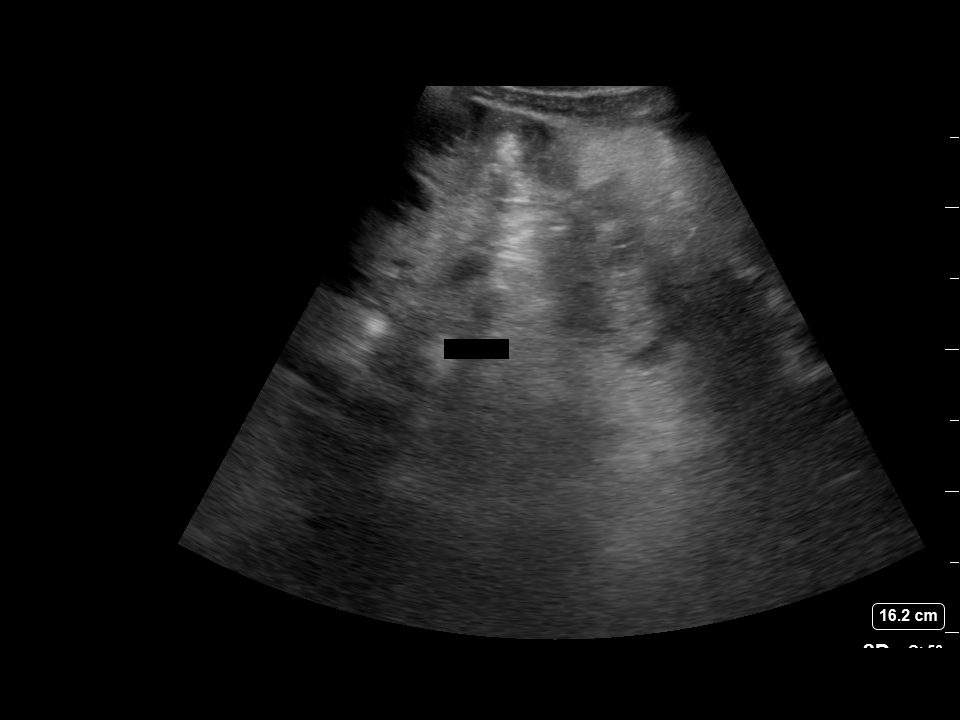
[im 3/3]
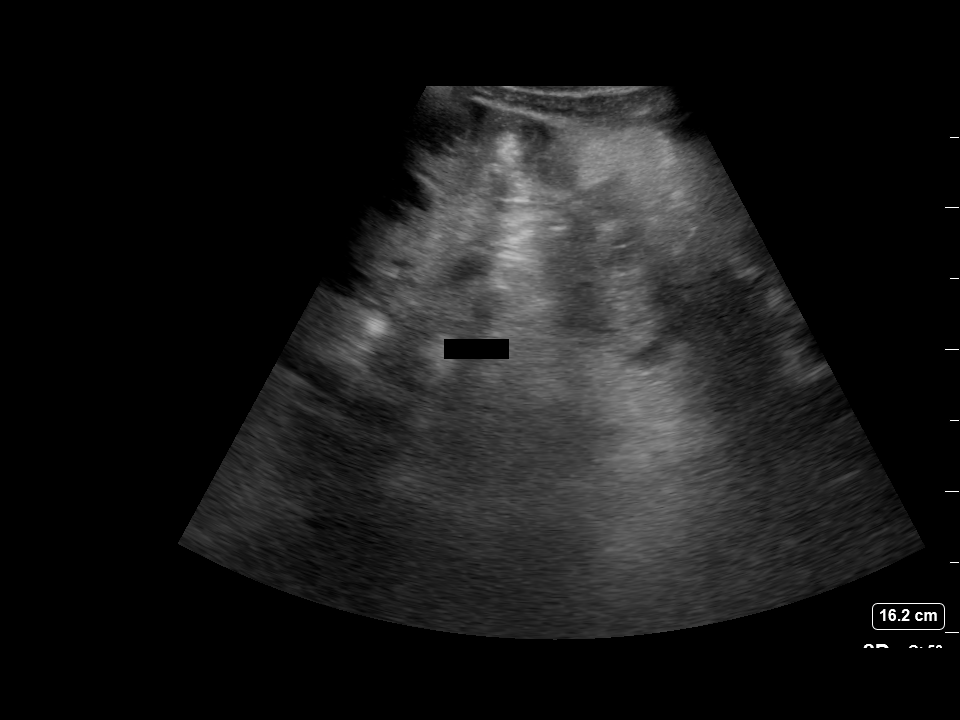

[3 of 3 positions shown; findings below may reference images not displayed]

EXAM:
ULTRASOUND GUIDED DIAGNOSTIC AND THERAPEUTIC PARACENTESIS

MEDICATIONS:
10 mL 2% lidocaine

COMPLICATIONS:
None immediate.

PROCEDURE:
Informed written consent was obtained from the patient after a
discussion of the risks, benefits and alternatives to treatment. A
timeout was performed prior to the initiation of the procedure.

Initial ultrasound scanning demonstrates a large amount of ascites
within the right lateral abdomen. The right lateral abdomen was
prepped and draped in the usual sterile fashion. 2% lidocaine was
used for local anesthesia.

Following this, a 19 gauge, 7-cm, Yueh catheter was introduced. An
ultrasound image was saved for documentation purposes. The
paracentesis was performed. The catheter was removed and a dressing
was applied. The patient tolerated the procedure well without
immediate post procedural complication.
FINDINGS: A total of approximately 3.2 liters of yellow, turbid fluid was
removed.
IMPRESSION: Successful ultrasound-guided therapeutic paracentesis yielding
liters of peritoneal fluid.

## 2019-08-14 IMAGING — US IR ABDOMEN US LIMITED
1 series · 4 of 4 positions shown · non-contrast
Comparison: Ultrasound-guided paracentesis - 08/03/2017

CLINICAL DATA: History of cirrhosis with recurrent abdominal
ascites. Please perform ascites search ultrasound ultrasound-guided
paracentesis as indicated.

EXAM:
LIMITED ABDOMEN ULTRASOUND FOR ASCITES
TECHNIQUE: Limited ultrasound survey for ascites was performed in all four
abdominal quadrants.

[Series 1: ir (id) (id)/(id)/(id) ir · 4 of 4 slices shown]
[im 1/4]
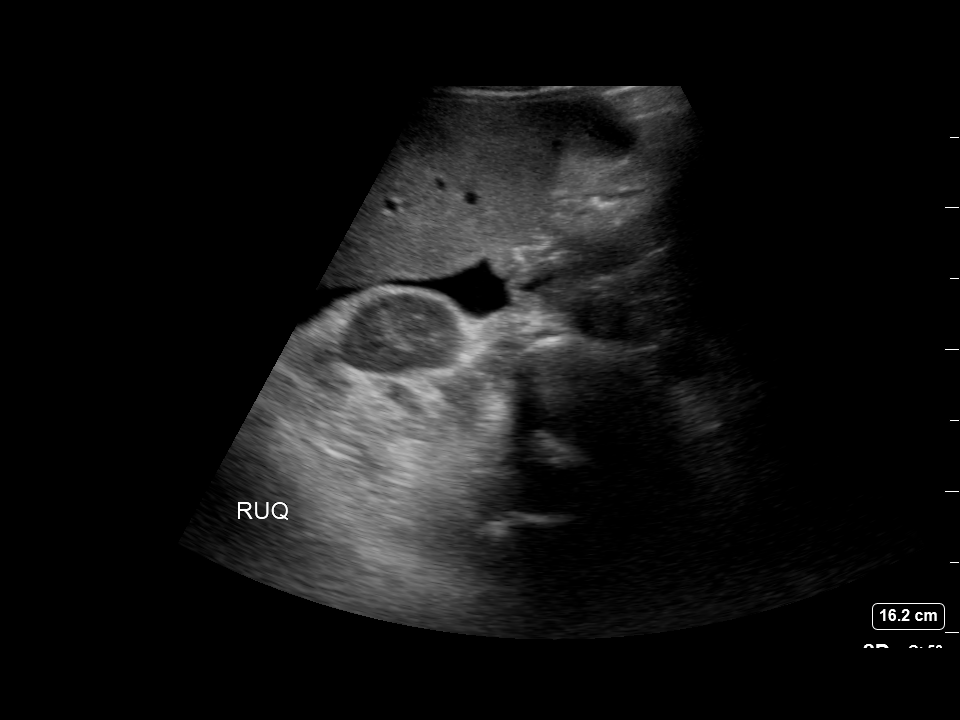
[im 2/4]
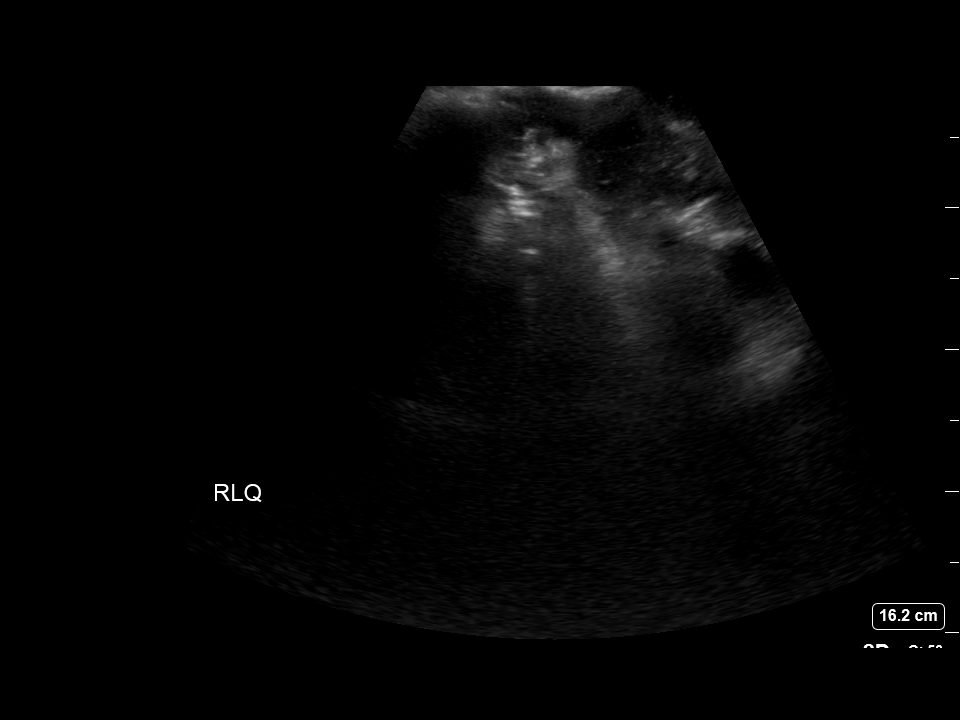
[im 3/4]
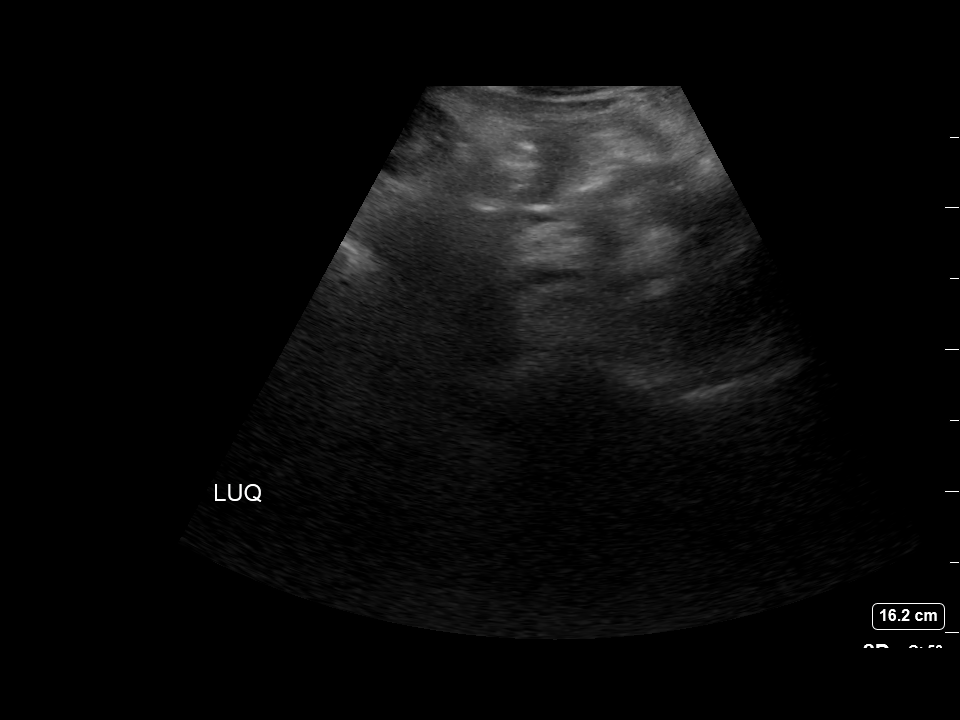
[im 4/4]
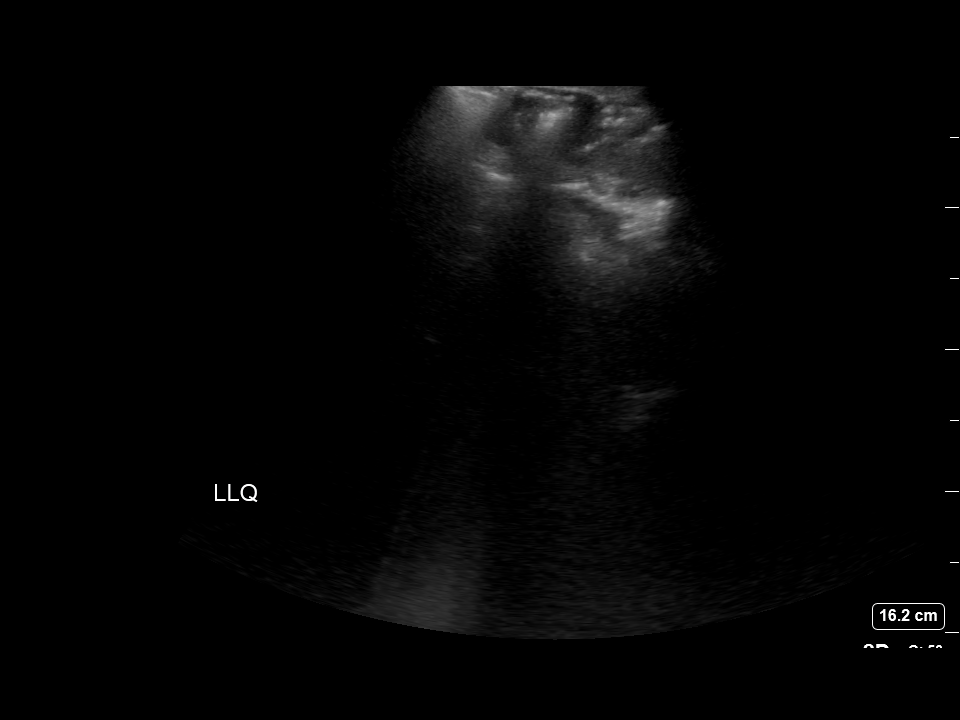

[4 of 4 positions shown; findings below may reference images not displayed]

FINDINGS: Sonographic evaluation demonstrates only a trace amount of fluid
within the right upper abdominal quadrant, too small to allow for
safe ultrasound-guided paracentesis.
IMPRESSION: Trace amount of intra-abdominal ascites, too small to allow for safe
ultrasound-guided paracentesis.
